# Patient Record
Sex: Female | Born: 1945 | Race: White | Hispanic: No | Smoking: Never smoker
Health system: Southern US, Community
[De-identification: ages and names within clinical notes are randomized; demographics above are authoritative.]

## PROBLEM LIST (undated history)

## (undated) DIAGNOSIS — E049 Nontoxic goiter, unspecified: Secondary | ICD-10-CM

## (undated) DIAGNOSIS — C439 Malignant melanoma of skin, unspecified: Secondary | ICD-10-CM

## (undated) DIAGNOSIS — M797 Fibromyalgia: Secondary | ICD-10-CM

## (undated) DIAGNOSIS — Z780 Asymptomatic menopausal state: Secondary | ICD-10-CM

## (undated) DIAGNOSIS — Z9889 Other specified postprocedural states: Secondary | ICD-10-CM

## (undated) DIAGNOSIS — E079 Disorder of thyroid, unspecified: Secondary | ICD-10-CM

## (undated) DIAGNOSIS — M1990 Unspecified osteoarthritis, unspecified site: Secondary | ICD-10-CM

## (undated) DIAGNOSIS — R197 Diarrhea, unspecified: Secondary | ICD-10-CM

## (undated) DIAGNOSIS — C801 Malignant (primary) neoplasm, unspecified: Secondary | ICD-10-CM

## (undated) DIAGNOSIS — IMO0001 Reserved for inherently not codable concepts without codable children: Secondary | ICD-10-CM

## (undated) DIAGNOSIS — W57XXXA Bitten or stung by nonvenomous insect and other nonvenomous arthropods, initial encounter: Secondary | ICD-10-CM

## (undated) DIAGNOSIS — I2510 Atherosclerotic heart disease of native coronary artery without angina pectoris: Secondary | ICD-10-CM

## (undated) DIAGNOSIS — IMO0002 Reserved for concepts with insufficient information to code with codable children: Secondary | ICD-10-CM

## (undated) DIAGNOSIS — T7840XA Allergy, unspecified, initial encounter: Secondary | ICD-10-CM

## (undated) DIAGNOSIS — E785 Hyperlipidemia, unspecified: Secondary | ICD-10-CM

## (undated) DIAGNOSIS — R011 Cardiac murmur, unspecified: Secondary | ICD-10-CM

## (undated) DIAGNOSIS — N200 Calculus of kidney: Secondary | ICD-10-CM

## (undated) HISTORY — DX: Asymptomatic menopausal state: Z78.0

## (undated) HISTORY — DX: Bitten or stung by nonvenomous insect and other nonvenomous arthropods, initial encounter: W57.XXXA

## (undated) HISTORY — DX: Other specified postprocedural states: Z98.890

## (undated) HISTORY — DX: Calculus of kidney: N20.0

## (undated) HISTORY — DX: Diarrhea, unspecified: R19.7

## (undated) HISTORY — DX: Reserved for inherently not codable concepts without codable children: IMO0001

## (undated) HISTORY — DX: Allergy, unspecified, initial encounter: T78.40XA

## (undated) HISTORY — DX: Hyperlipidemia, unspecified: E78.5

## (undated) HISTORY — PX: KNEE ARTHROSCOPY W/ ACL RECONSTRUCTION: SHX1858

## (undated) HISTORY — DX: Atherosclerotic heart disease of native coronary artery without angina pectoris: I25.10

## (undated) HISTORY — DX: Malignant (primary) neoplasm, unspecified: C80.1

## (undated) HISTORY — DX: Reserved for concepts with insufficient information to code with codable children: IMO0002

## (undated) HISTORY — DX: Nontoxic goiter, unspecified: E04.9

## (undated) HISTORY — DX: Malignant melanoma of skin, unspecified: C43.9

## (undated) HISTORY — DX: Cardiac murmur, unspecified: R01.1

## (undated) HISTORY — DX: Unspecified osteoarthritis, unspecified site: M19.90

## (undated) HISTORY — DX: Fibromyalgia: M79.7

## (undated) HISTORY — DX: Disorder of thyroid, unspecified: E07.9

---

## 1980-08-18 HISTORY — PX: FOOT TENDON SURGERY: SHX958

## 1980-08-18 HISTORY — PX: THYROIDECTOMY: SHX17

## 1985-08-18 HISTORY — PX: ABDOMINAL HYSTERECTOMY: SHX81

## 1997-08-18 HISTORY — PX: OVARIAN CYST REMOVAL: SHX89

## 1998-02-15 ENCOUNTER — Other Ambulatory Visit: Admission: RE | Admit: 1998-02-15 | Discharge: 1998-02-15 | Payer: Self-pay | Admitting: Obstetrics and Gynecology

## 1999-08-09 ENCOUNTER — Encounter: Admission: RE | Admit: 1999-08-09 | Discharge: 1999-08-09 | Payer: Self-pay | Admitting: Family Medicine

## 1999-08-09 ENCOUNTER — Encounter: Payer: Self-pay | Admitting: Family Medicine

## 2000-07-21 ENCOUNTER — Other Ambulatory Visit: Admission: RE | Admit: 2000-07-21 | Discharge: 2000-07-21 | Payer: Self-pay | Admitting: Family Medicine

## 2001-10-09 ENCOUNTER — Observation Stay (HOSPITAL_COMMUNITY): Admission: EM | Admit: 2001-10-09 | Discharge: 2001-10-10 | Payer: Self-pay | Admitting: Emergency Medicine

## 2001-10-09 ENCOUNTER — Encounter: Payer: Self-pay | Admitting: Emergency Medicine

## 2002-10-14 ENCOUNTER — Ambulatory Visit (HOSPITAL_COMMUNITY): Admission: RE | Admit: 2002-10-14 | Discharge: 2002-10-14 | Payer: Self-pay | Admitting: Family Medicine

## 2003-11-23 ENCOUNTER — Other Ambulatory Visit: Admission: RE | Admit: 2003-11-23 | Discharge: 2003-11-23 | Payer: Self-pay | Admitting: Family Medicine

## 2004-06-27 ENCOUNTER — Encounter: Admission: RE | Admit: 2004-06-27 | Discharge: 2004-06-27 | Payer: Self-pay | Admitting: Endocrinology

## 2004-09-19 ENCOUNTER — Ambulatory Visit (HOSPITAL_COMMUNITY): Admission: RE | Admit: 2004-09-19 | Discharge: 2004-09-19 | Payer: Self-pay | Admitting: Gastroenterology

## 2004-12-02 ENCOUNTER — Encounter: Payer: Self-pay | Admitting: Gastroenterology

## 2004-12-03 ENCOUNTER — Encounter: Payer: Self-pay | Admitting: Gastroenterology

## 2004-12-04 ENCOUNTER — Other Ambulatory Visit: Admission: RE | Admit: 2004-12-04 | Discharge: 2004-12-04 | Payer: Self-pay | Admitting: Family Medicine

## 2005-08-09 ENCOUNTER — Encounter: Payer: Self-pay | Admitting: Family Medicine

## 2005-10-16 ENCOUNTER — Encounter: Admission: RE | Admit: 2005-10-16 | Discharge: 2005-10-16 | Payer: Self-pay | Admitting: Gastroenterology

## 2005-12-09 ENCOUNTER — Encounter: Payer: Self-pay | Admitting: Family Medicine

## 2005-12-09 ENCOUNTER — Other Ambulatory Visit: Admission: RE | Admit: 2005-12-09 | Discharge: 2005-12-09 | Payer: Self-pay | Admitting: Family Medicine

## 2005-12-09 ENCOUNTER — Ambulatory Visit: Payer: Self-pay | Admitting: Family Medicine

## 2005-12-27 ENCOUNTER — Ambulatory Visit: Payer: Self-pay | Admitting: Internal Medicine

## 2005-12-29 ENCOUNTER — Ambulatory Visit: Payer: Self-pay | Admitting: Family Medicine

## 2006-01-05 ENCOUNTER — Ambulatory Visit: Payer: Self-pay | Admitting: Family Medicine

## 2006-01-13 ENCOUNTER — Ambulatory Visit: Payer: Self-pay | Admitting: Family Medicine

## 2006-01-13 ENCOUNTER — Ambulatory Visit: Payer: Self-pay | Admitting: *Deleted

## 2006-01-13 ENCOUNTER — Encounter: Admission: RE | Admit: 2006-01-13 | Discharge: 2006-01-13 | Payer: Self-pay | Admitting: Family Medicine

## 2006-01-16 DIAGNOSIS — C801 Malignant (primary) neoplasm, unspecified: Secondary | ICD-10-CM

## 2006-01-16 HISTORY — DX: Malignant (primary) neoplasm, unspecified: C80.1

## 2006-02-26 ENCOUNTER — Ambulatory Visit: Payer: Self-pay | Admitting: Family Medicine

## 2006-03-03 ENCOUNTER — Ambulatory Visit: Payer: Self-pay

## 2006-03-03 ENCOUNTER — Encounter: Payer: Self-pay | Admitting: Cardiovascular Disease

## 2006-03-10 ENCOUNTER — Ambulatory Visit: Payer: Self-pay | Admitting: Family Medicine

## 2006-03-19 ENCOUNTER — Ambulatory Visit: Payer: Self-pay | Admitting: Family Medicine

## 2006-04-16 ENCOUNTER — Encounter: Admission: RE | Admit: 2006-04-16 | Discharge: 2006-04-16 | Payer: Self-pay | Admitting: Family Medicine

## 2006-05-06 ENCOUNTER — Encounter: Admission: RE | Admit: 2006-05-06 | Discharge: 2006-05-06 | Payer: Self-pay | Admitting: Endocrinology

## 2006-05-11 ENCOUNTER — Ambulatory Visit: Payer: Self-pay | Admitting: Gastroenterology

## 2006-06-17 ENCOUNTER — Ambulatory Visit: Payer: Self-pay | Admitting: *Deleted

## 2006-07-29 ENCOUNTER — Ambulatory Visit: Payer: Self-pay | Admitting: *Deleted

## 2006-08-04 ENCOUNTER — Ambulatory Visit: Payer: Self-pay | Admitting: Family Medicine

## 2006-08-04 LAB — CONVERTED CEMR LAB: Free T4: 0.6 ng/dL (ref 0.6–1.6)

## 2006-10-22 ENCOUNTER — Ambulatory Visit: Payer: Self-pay | Admitting: Internal Medicine

## 2006-10-30 ENCOUNTER — Ambulatory Visit: Payer: Self-pay | Admitting: Family Medicine

## 2006-10-30 LAB — CONVERTED CEMR LAB: TSH: 4.21 microintl units/mL (ref 0.35–5.50)

## 2006-11-12 ENCOUNTER — Encounter: Admission: RE | Admit: 2006-11-12 | Discharge: 2006-11-12 | Payer: Self-pay | Admitting: Endocrinology

## 2006-12-15 ENCOUNTER — Encounter: Payer: Self-pay | Admitting: Family Medicine

## 2006-12-15 ENCOUNTER — Other Ambulatory Visit: Admission: RE | Admit: 2006-12-15 | Discharge: 2006-12-15 | Payer: Self-pay | Admitting: Family Medicine

## 2006-12-15 ENCOUNTER — Ambulatory Visit: Payer: Self-pay | Admitting: Family Medicine

## 2006-12-15 DIAGNOSIS — E039 Hypothyroidism, unspecified: Secondary | ICD-10-CM

## 2006-12-15 DIAGNOSIS — Z87442 Personal history of urinary calculi: Secondary | ICD-10-CM | POA: Insufficient documentation

## 2006-12-15 DIAGNOSIS — E042 Nontoxic multinodular goiter: Secondary | ICD-10-CM | POA: Insufficient documentation

## 2006-12-15 DIAGNOSIS — M797 Fibromyalgia: Secondary | ICD-10-CM

## 2006-12-15 DIAGNOSIS — S83509A Sprain of unspecified cruciate ligament of unspecified knee, initial encounter: Secondary | ICD-10-CM

## 2006-12-15 DIAGNOSIS — C4359 Malignant melanoma of other part of trunk: Secondary | ICD-10-CM

## 2006-12-15 LAB — CONVERTED CEMR LAB
ALT: 25 units/L (ref 0–40)
Alkaline Phosphatase: 83 units/L (ref 39–117)
BUN: 9 mg/dL (ref 6–23)
Bilirubin, Direct: 0.1 mg/dL (ref 0.0–0.3)
CO2: 30 meq/L (ref 19–32)
Chloride: 106 meq/L (ref 96–112)
Creatinine, Ser: 0.7 mg/dL (ref 0.4–1.2)
Eosinophils Absolute: 0.1 10*3/uL (ref 0.0–0.6)
HCT: 39.7 % (ref 36.0–46.0)
Hemoglobin: 13.8 g/dL (ref 12.0–15.0)
Hgb A1c MFr Bld: 6.1 % — ABNORMAL HIGH (ref 4.6–6.0)
Lymphocytes Relative: 27.5 % (ref 12.0–46.0)
MCV: 89.3 fL (ref 78.0–100.0)
Neutro Abs: 4.5 10*3/uL (ref 1.4–7.7)
Platelets: 325 10*3/uL (ref 150–400)
Protein, U semiquant: NEGATIVE
RBC: 4.45 M/uL (ref 3.87–5.11)
RDW: 12.9 % (ref 11.5–14.6)
Sodium: 142 meq/L (ref 135–145)
Specific Gravity, Urine: 1.02
Total Protein: 6.7 g/dL (ref 6.0–8.3)
Urobilinogen, UA: 0.2
Vitamin B-12: 721 pg/mL (ref 211–911)
WBC Urine, dipstick: NEGATIVE
pH: 6.5

## 2007-02-03 IMAGING — RF DG ESOPHAGUS
7 series · 19 of 24 positions shown · non-contrast
Comparison: None.

CLINICAL DATA: Dysphagia.  
 BARIUM ESOPHAGRAM:

[Series 1: run · 4 of 8 slices shown (1 of 7)]
[im 1/8]
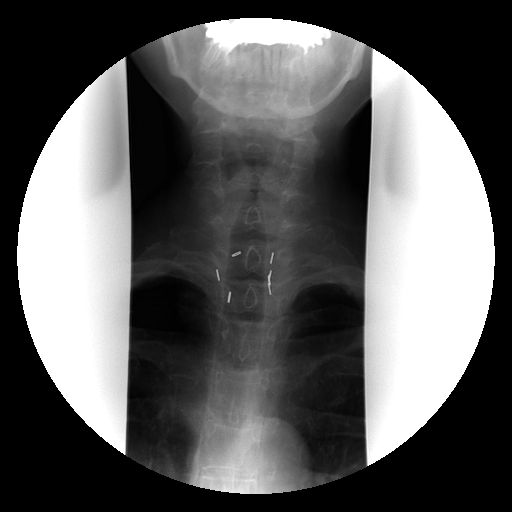
[im 2/8]
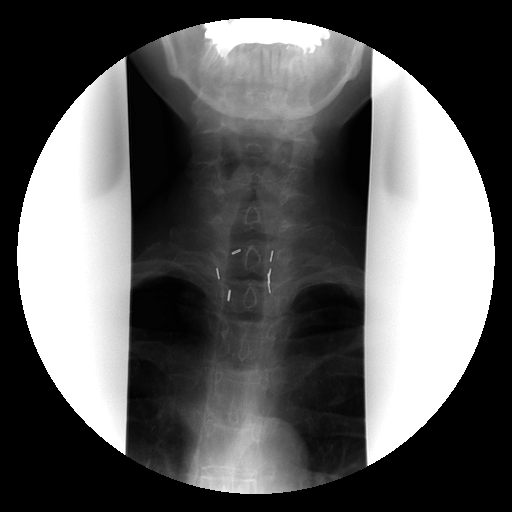
[im 6/8]
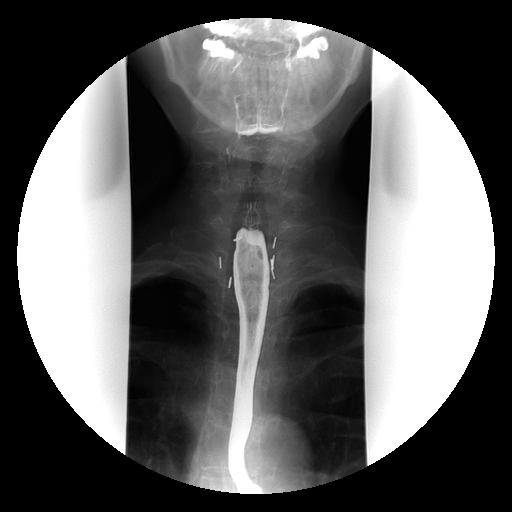
[im 8/8]
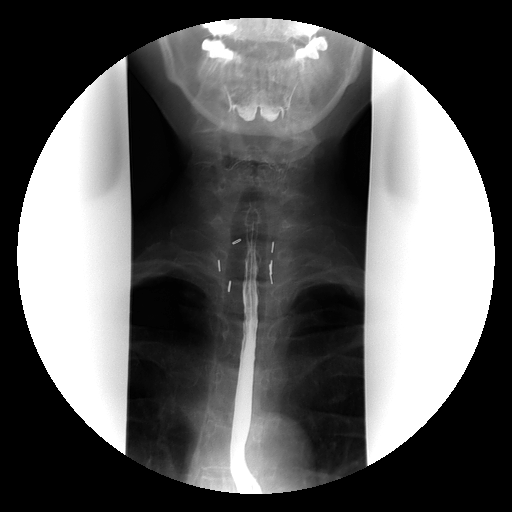

[Series 2: run · 3 of 6 slices shown (2 of 7)]
[im 1/6]
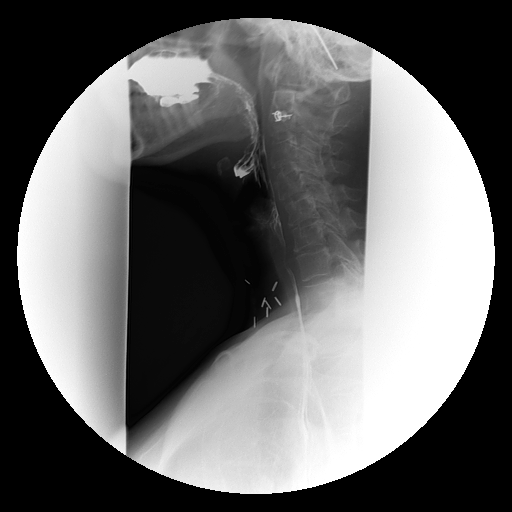
[im 2/6]
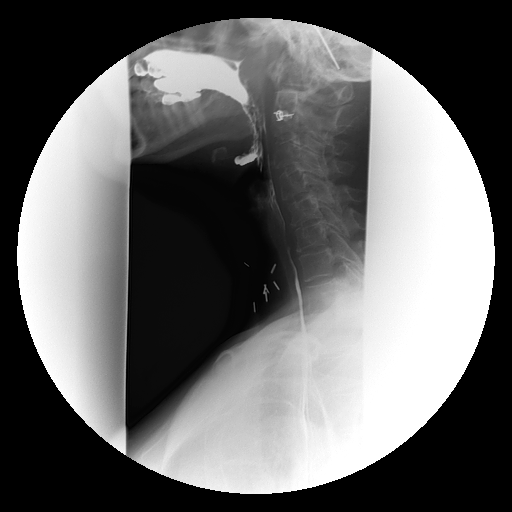
[im 6/6]
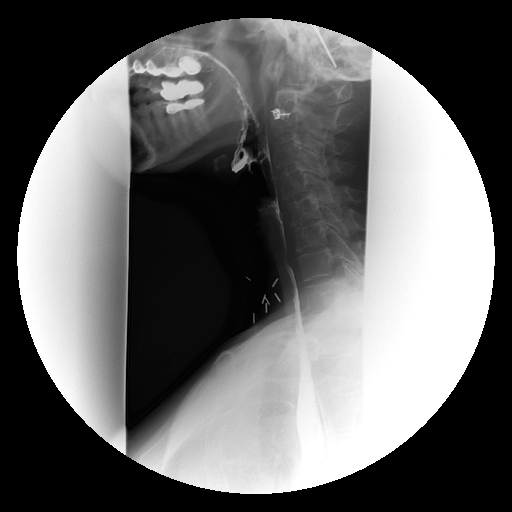

[Series 3: run · 2 of 5 slices shown (3 of 7)]
[im 1/5]
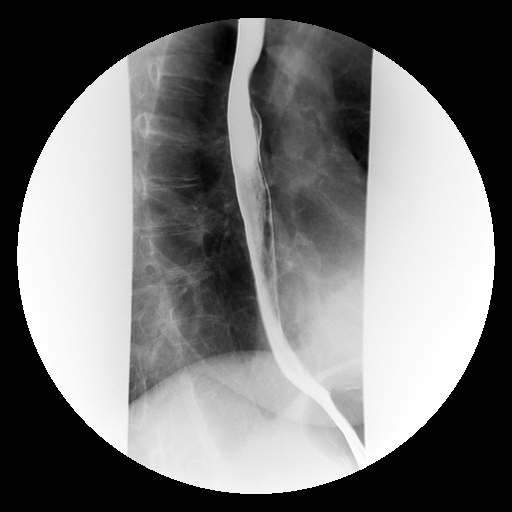
[im 3/5]
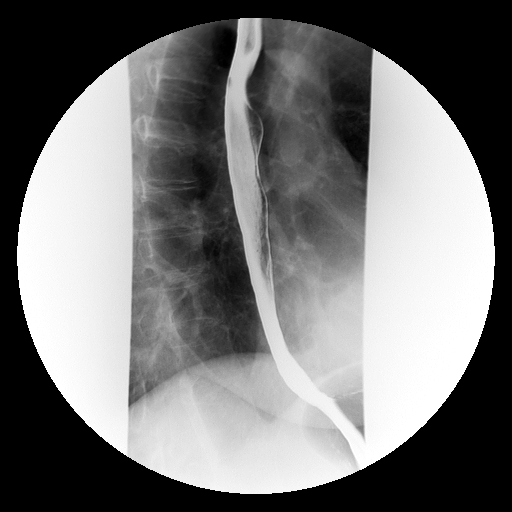

[Series 4: run · 6 of 10 slices shown (4 of 7)]
[im 1/10]
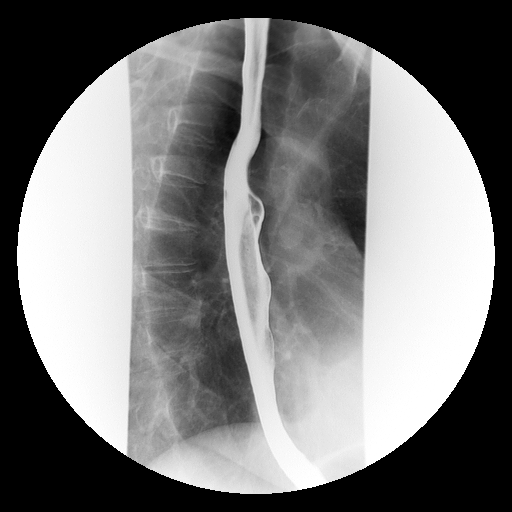
[im 2/10]
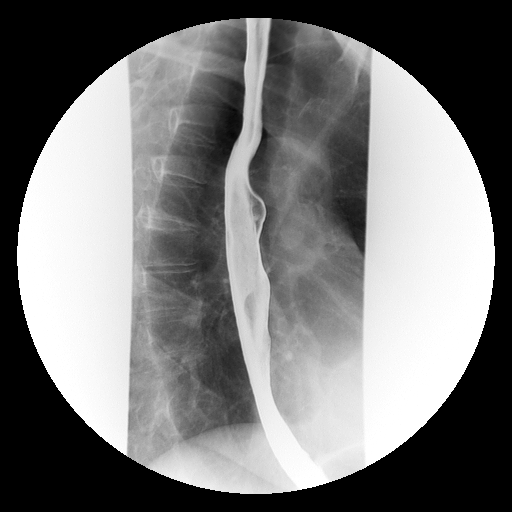
[im 4/10]
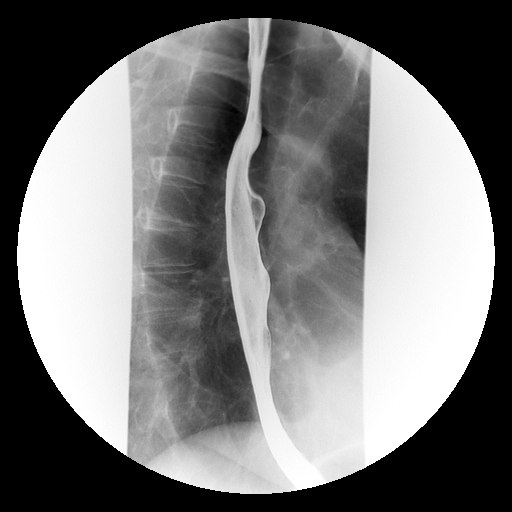
[im 5/10]
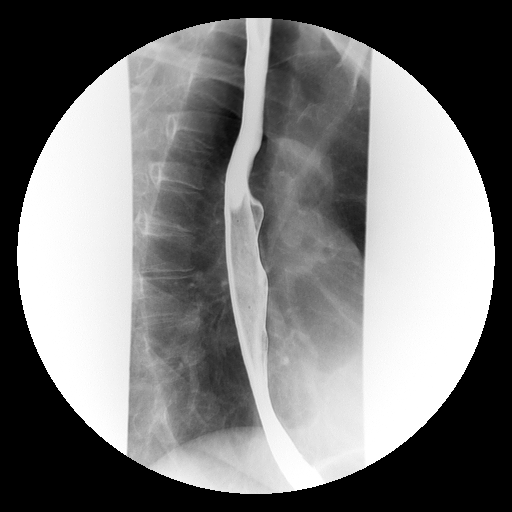
[im 8/10]
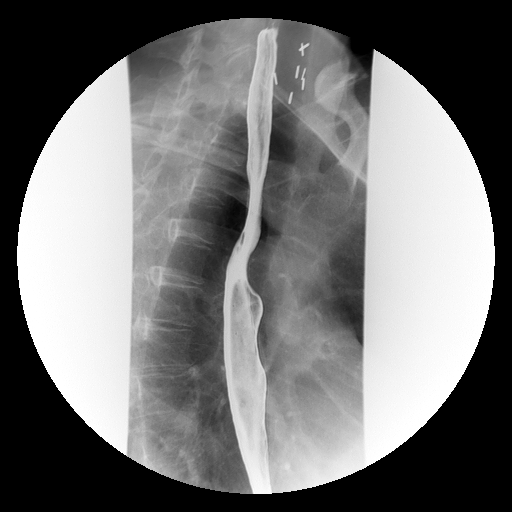
[im 10/10]
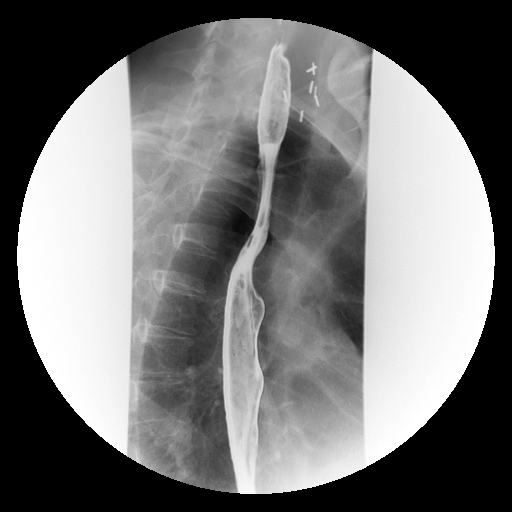

[Series 5: run · 2 of 4 slices shown (5 of 7)]
[im 1/4]
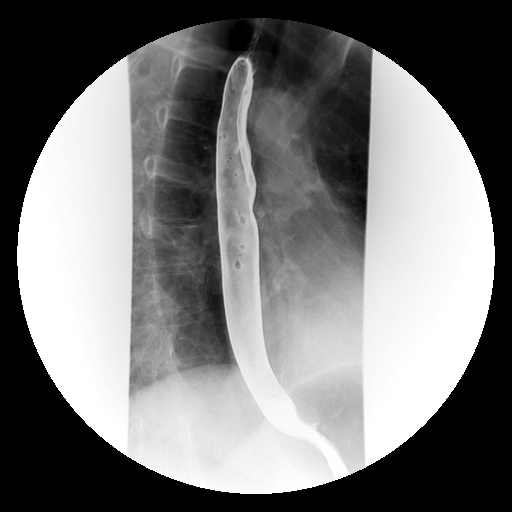
[im 2/4]
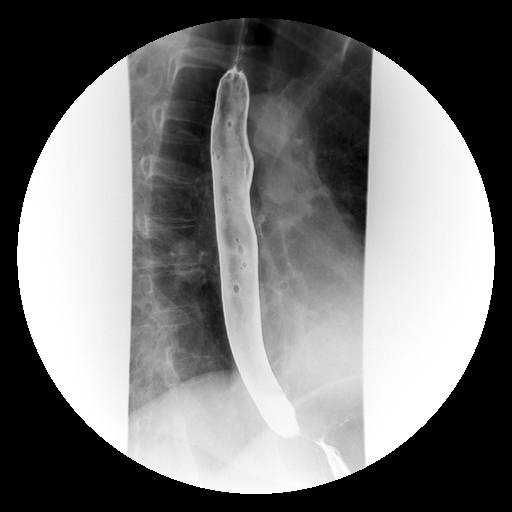

[Series 6: run · 1 of 1 slices shown (6 of 7)]
[im 1/1]
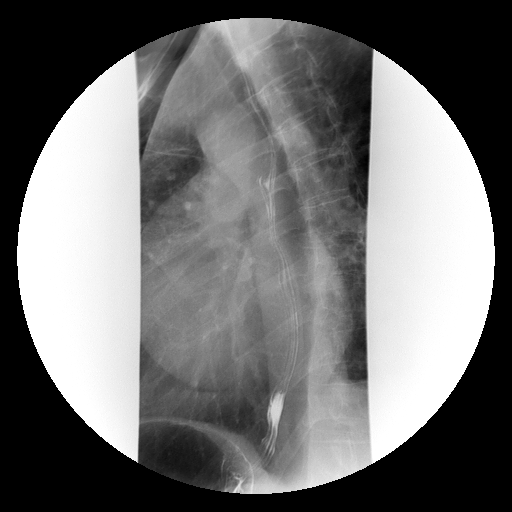

[Series 7: run · 1 of 1 slices shown (7 of 7)]
[im 1/1]
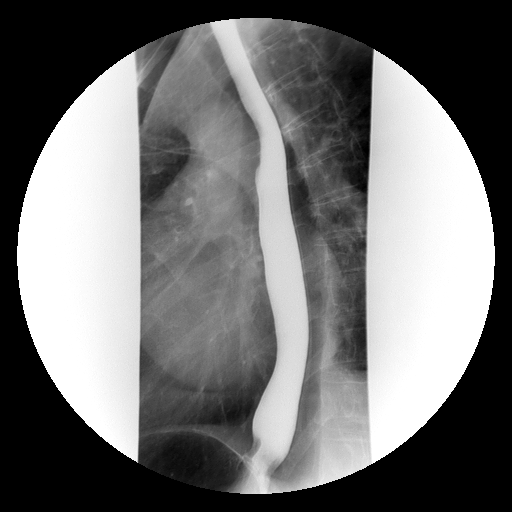

[19 of 24 positions shown; findings below may reference images not displayed]

AP and lateral imaging of the hypopharynx was assessed during swallowing.  These views are normal.  Patient is noted to have surgical clips in the anterior neck consistent with prior thyroidectomy.  
 Double contrast evaluation of the esophagus is normal.  Specifically, there is no evidence for esophageal stricture, mass effect, diverticulum, or mucosal ulceration.  No evidence for hiatal hernia.  Mucosal relief views demonstrate no esophageal fold thickening to suggest esophagitis. 
 The patient was placed in an RAO prone position and monitored fluoroscopically while swallowing.  Primary peristalsis is preserved on all swallows with normal esophageal motility.
IMPRESSION: Normal barium esophagram.

## 2007-03-30 ENCOUNTER — Encounter: Payer: Self-pay | Admitting: Family Medicine

## 2007-04-12 ENCOUNTER — Ambulatory Visit: Payer: Self-pay | Admitting: Family Medicine

## 2007-04-12 DIAGNOSIS — R197 Diarrhea, unspecified: Secondary | ICD-10-CM | POA: Insufficient documentation

## 2007-04-12 DIAGNOSIS — B373 Candidiasis of vulva and vagina: Secondary | ICD-10-CM

## 2007-04-13 ENCOUNTER — Encounter: Payer: Self-pay | Admitting: Family Medicine

## 2007-04-16 ENCOUNTER — Ambulatory Visit: Payer: Self-pay | Admitting: Internal Medicine

## 2007-04-16 ENCOUNTER — Encounter (INDEPENDENT_AMBULATORY_CARE_PROVIDER_SITE_OTHER): Payer: Self-pay | Admitting: *Deleted

## 2007-08-04 IMAGING — US US ABDOMEN COMPLETE
1 series · 14 of 25 positions shown · non-contrast
Comparison: none

CLINICAL DATA: Abdominal pain. Elevated LFTs.
 ABDOMEN ULTRASOUND:
TECHNIQUE: Complete abdominal ultrasound examination was performed including evaluation of the liver, gallbladder, bile ducts, pancreas, kidneys, spleen, IVC, and abdominal aorta.
 The gallbladder is well seen and no gallstones are noted.  No gallbladder wall thickening is seen.  The liver has a normal echogenic pattern.  The common bile duct is normal measuring 2.1 to 3.0 mm in diameter.  The IVC, pancreas, and spleen are normal.  No hydronephrosis is noted.  The right kidney measures 11.1 cm sagittally with the left kidney measuring 11.6 cm.  The abdominal aorta is normal in caliber.

[Series 1: us abdomen complete · 0.31mm/px · 14 of 79 slices shown]
[im 1/79]
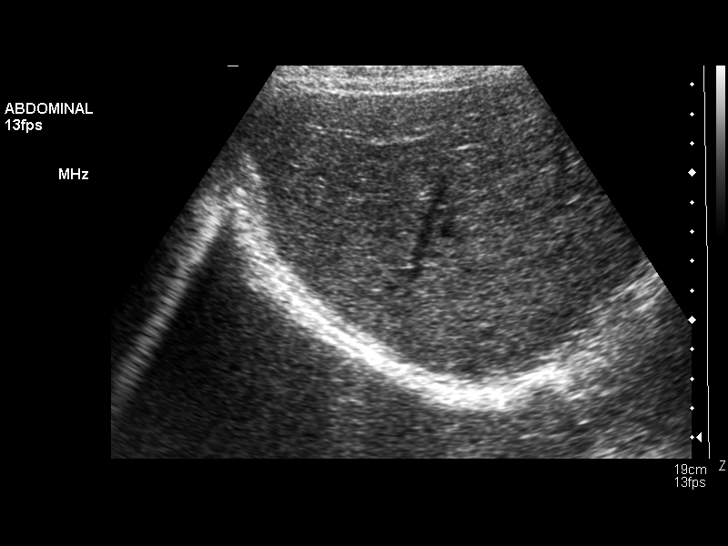
[im 7/79]
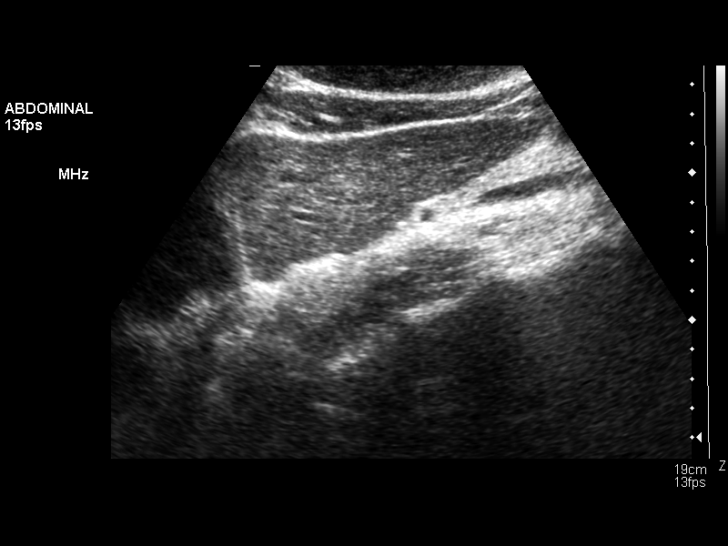
[im 14/79]
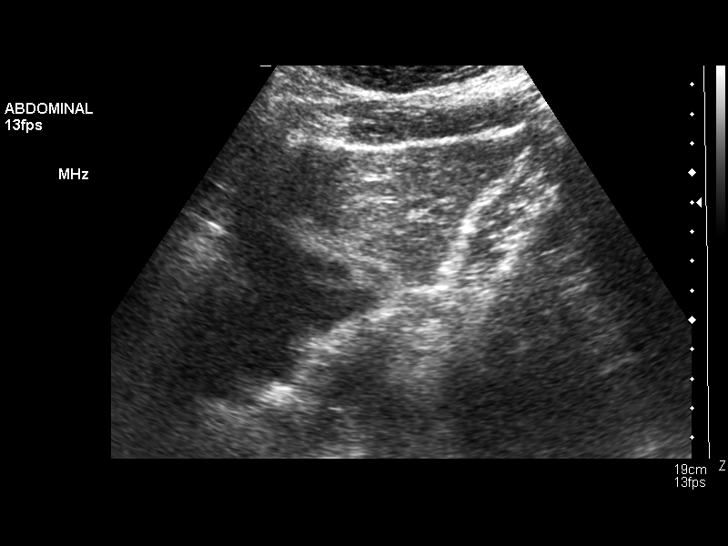
[im 20/79]
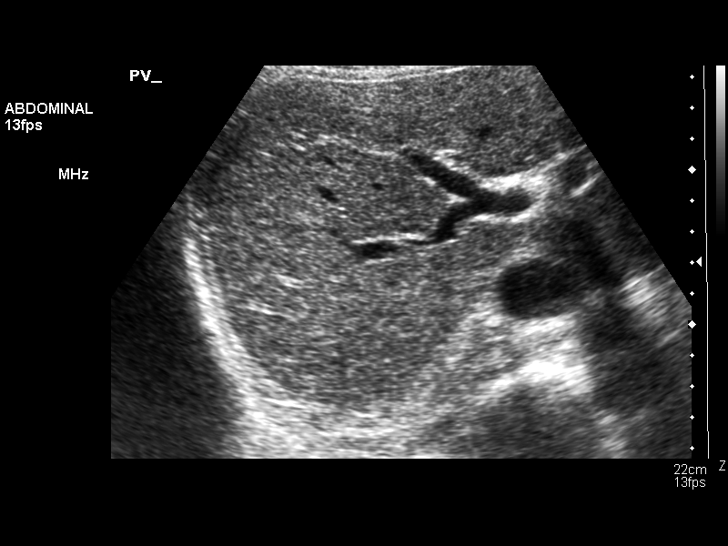
[im 27/79]
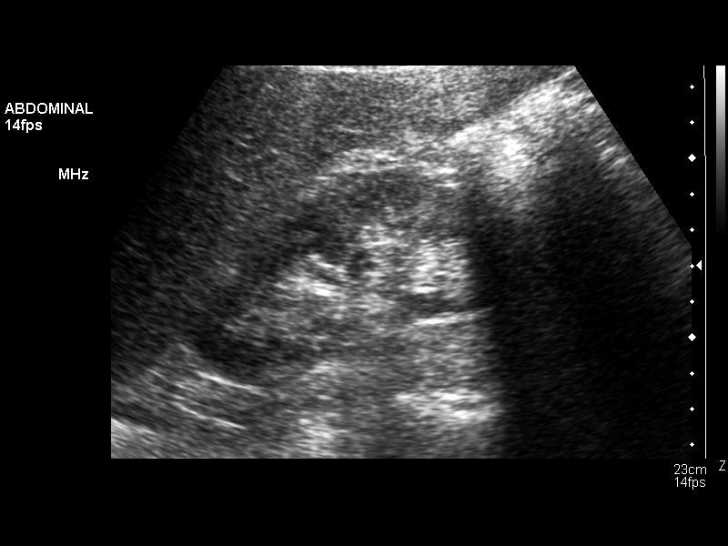
[im 30/79]
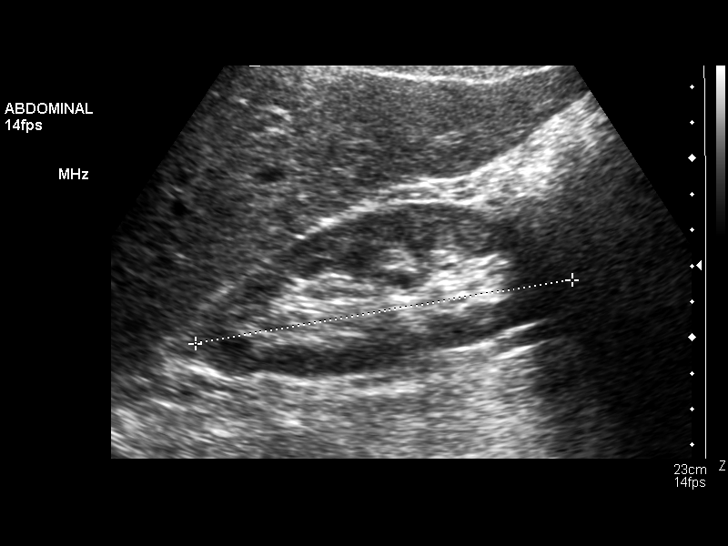
[im 36/79]
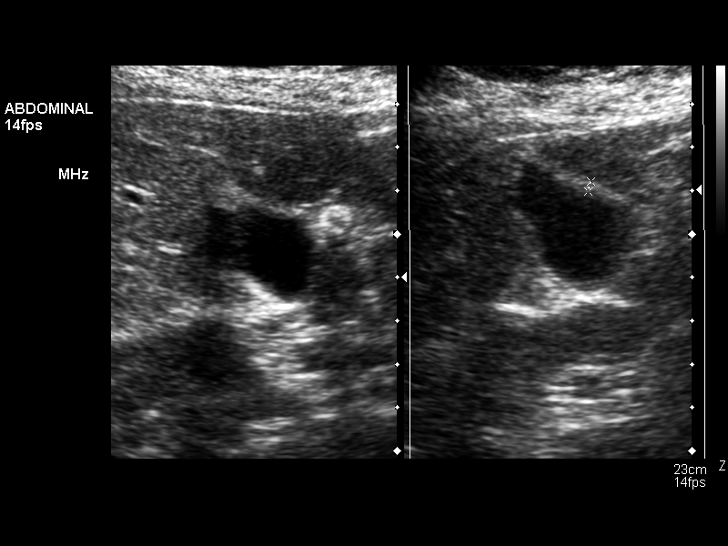
[im 43/79]
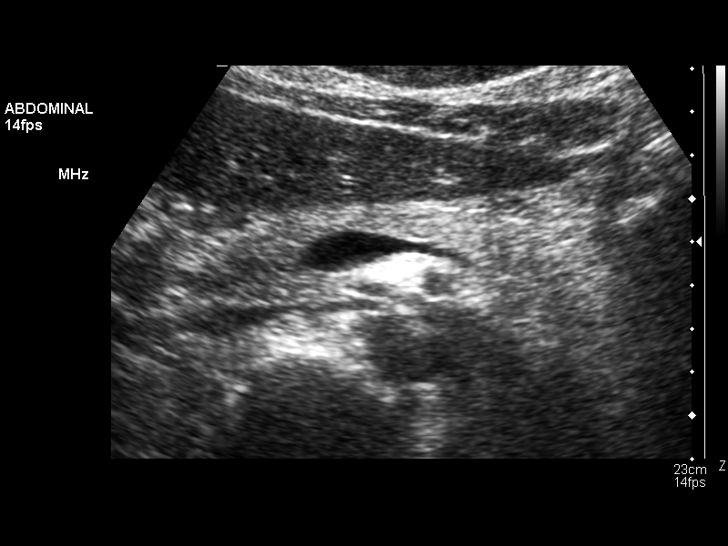
[im 49/79]
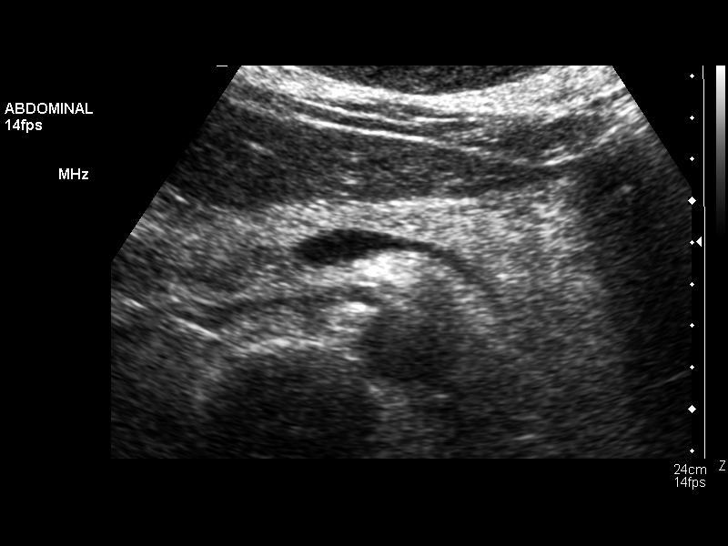
[im 53/79]
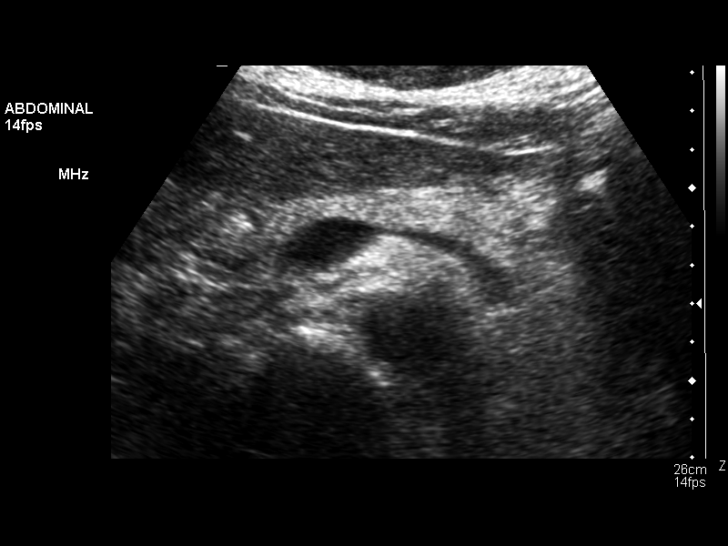
[im 59/79]
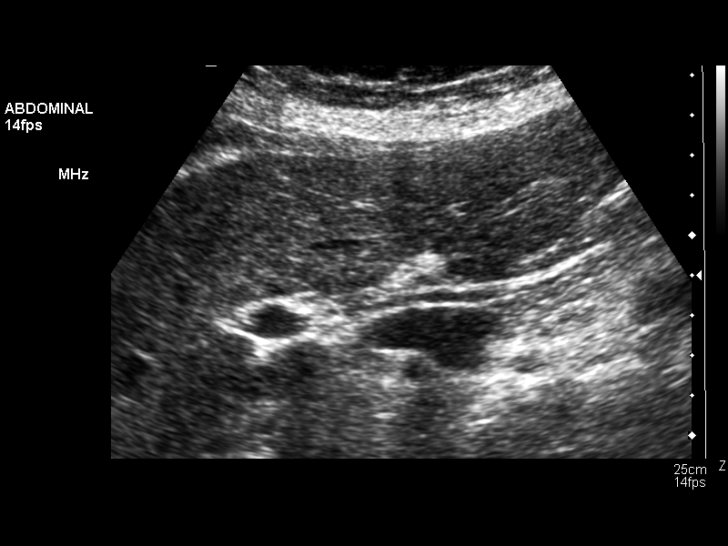
[im 66/79]
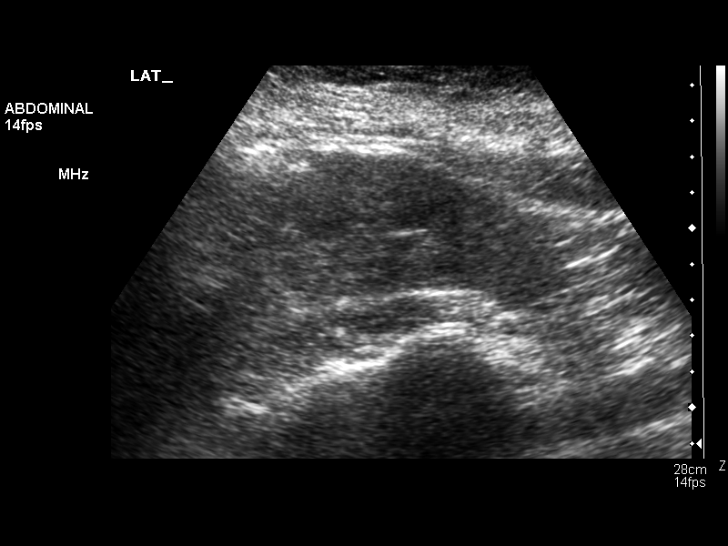
[im 72/79]
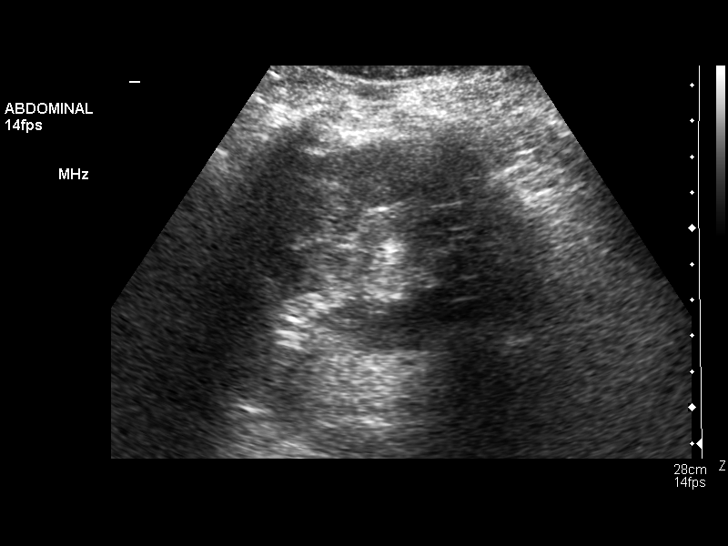
[im 79/79]
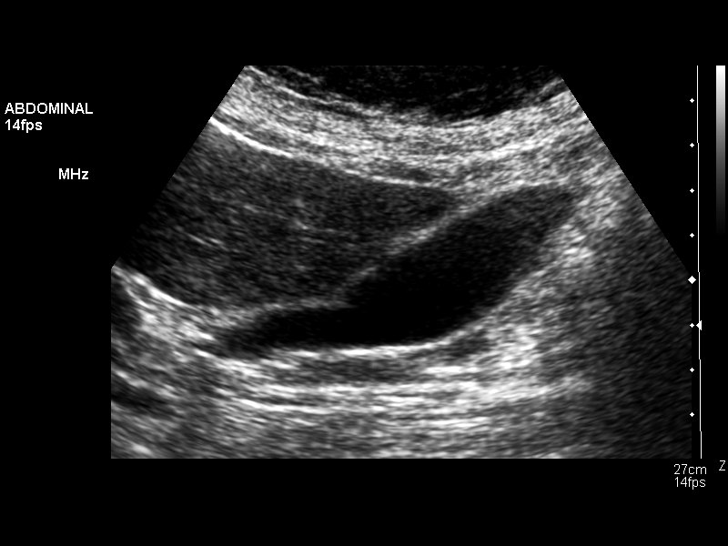

[14 of 25 positions shown; findings below may reference images not displayed]

IMPRESSION: 1.  No gallstones.
 2.  No gallbladder wall thickening or ductal dilatation is seen.

## 2007-10-11 ENCOUNTER — Encounter: Payer: Self-pay | Admitting: Family Medicine

## 2007-10-14 ENCOUNTER — Ambulatory Visit: Payer: Self-pay | Admitting: Family Medicine

## 2007-10-14 DIAGNOSIS — IMO0002 Reserved for concepts with insufficient information to code with codable children: Secondary | ICD-10-CM

## 2007-10-25 ENCOUNTER — Encounter: Payer: Self-pay | Admitting: Family Medicine

## 2007-11-19 ENCOUNTER — Encounter: Payer: Self-pay | Admitting: Family Medicine

## 2007-11-19 LAB — HM MAMMOGRAPHY: HM Mammogram: NORMAL

## 2007-12-15 ENCOUNTER — Telehealth (INDEPENDENT_AMBULATORY_CARE_PROVIDER_SITE_OTHER): Payer: Self-pay | Admitting: *Deleted

## 2007-12-16 ENCOUNTER — Ambulatory Visit: Payer: Self-pay | Admitting: Internal Medicine

## 2007-12-16 DIAGNOSIS — R609 Edema, unspecified: Secondary | ICD-10-CM

## 2007-12-16 DIAGNOSIS — B002 Herpesviral gingivostomatitis and pharyngotonsillitis: Secondary | ICD-10-CM

## 2007-12-16 LAB — CONVERTED CEMR LAB: Vit D, 1,25-Dihydroxy: 37 (ref 30–89)

## 2007-12-24 ENCOUNTER — Encounter: Payer: Self-pay | Admitting: Internal Medicine

## 2007-12-28 ENCOUNTER — Encounter (INDEPENDENT_AMBULATORY_CARE_PROVIDER_SITE_OTHER): Payer: Self-pay | Admitting: *Deleted

## 2008-02-09 ENCOUNTER — Other Ambulatory Visit: Admission: RE | Admit: 2008-02-09 | Discharge: 2008-02-09 | Payer: Self-pay | Admitting: Family Medicine

## 2008-02-09 ENCOUNTER — Ambulatory Visit: Payer: Self-pay | Admitting: Family Medicine

## 2008-02-09 ENCOUNTER — Encounter: Payer: Self-pay | Admitting: Family Medicine

## 2008-02-09 DIAGNOSIS — N952 Postmenopausal atrophic vaginitis: Secondary | ICD-10-CM

## 2008-02-09 DIAGNOSIS — C439 Malignant melanoma of skin, unspecified: Secondary | ICD-10-CM

## 2008-02-09 DIAGNOSIS — G47419 Narcolepsy without cataplexy: Secondary | ICD-10-CM | POA: Insufficient documentation

## 2008-02-09 DIAGNOSIS — G4700 Insomnia, unspecified: Secondary | ICD-10-CM

## 2008-02-09 DIAGNOSIS — L740 Miliaria rubra: Secondary | ICD-10-CM | POA: Insufficient documentation

## 2008-02-10 ENCOUNTER — Encounter (INDEPENDENT_AMBULATORY_CARE_PROVIDER_SITE_OTHER): Payer: Self-pay | Admitting: *Deleted

## 2008-02-15 ENCOUNTER — Telehealth (INDEPENDENT_AMBULATORY_CARE_PROVIDER_SITE_OTHER): Payer: Self-pay | Admitting: *Deleted

## 2008-02-15 DIAGNOSIS — N951 Menopausal and female climacteric states: Secondary | ICD-10-CM | POA: Insufficient documentation

## 2008-02-15 LAB — CONVERTED CEMR LAB
ALT: 27 units/L (ref 0–35)
AST: 25 units/L (ref 0–37)
Alkaline Phosphatase: 75 units/L (ref 39–117)
Basophils Relative: 0.4 % (ref 0.0–1.0)
Calcium: 9.4 mg/dL (ref 8.4–10.5)
Chloride: 106 meq/L (ref 96–112)
Cholesterol: 260 mg/dL (ref 0–200)
Creatinine, Ser: 0.7 mg/dL (ref 0.4–1.2)
Direct LDL: 173.6 mg/dL
Eosinophils Absolute: 0.1 10*3/uL (ref 0.0–0.7)
Eosinophils Relative: 1.2 % (ref 0.0–5.0)
GFR calc non Af Amer: 90 mL/min
Glucose, Bld: 100 mg/dL — ABNORMAL HIGH (ref 70–99)
Hemoglobin: 14 g/dL (ref 12.0–15.0)
Lymphocytes Relative: 31.6 % (ref 12.0–46.0)
MCHC: 35 g/dL (ref 30.0–36.0)
Monocytes Relative: 4.9 % (ref 3.0–12.0)
Neutro Abs: 4.1 10*3/uL (ref 1.4–7.7)
Potassium: 3.9 meq/L (ref 3.5–5.1)
RDW: 12.3 % (ref 11.5–14.6)
Total CHOL/HDL Ratio: 4.6
Triglycerides: 112 mg/dL (ref 0–149)
VLDL: 22 mg/dL (ref 0–40)
Vitamin B-12: 681 pg/mL (ref 211–911)

## 2008-02-16 ENCOUNTER — Encounter (INDEPENDENT_AMBULATORY_CARE_PROVIDER_SITE_OTHER): Payer: Self-pay | Admitting: *Deleted

## 2008-03-16 ENCOUNTER — Ambulatory Visit: Payer: Self-pay | Admitting: Internal Medicine

## 2008-03-16 ENCOUNTER — Ambulatory Visit: Payer: Self-pay | Admitting: Family Medicine

## 2008-03-17 ENCOUNTER — Ambulatory Visit: Payer: Self-pay | Admitting: Internal Medicine

## 2008-03-27 ENCOUNTER — Telehealth (INDEPENDENT_AMBULATORY_CARE_PROVIDER_SITE_OTHER): Payer: Self-pay | Admitting: *Deleted

## 2008-03-31 ENCOUNTER — Telehealth (INDEPENDENT_AMBULATORY_CARE_PROVIDER_SITE_OTHER): Payer: Self-pay | Admitting: *Deleted

## 2008-04-03 ENCOUNTER — Ambulatory Visit: Payer: Self-pay | Admitting: Family Medicine

## 2008-04-03 DIAGNOSIS — M79609 Pain in unspecified limb: Secondary | ICD-10-CM

## 2008-04-04 ENCOUNTER — Encounter: Payer: Self-pay | Admitting: Family Medicine

## 2008-04-04 ENCOUNTER — Ambulatory Visit: Payer: Self-pay

## 2008-04-04 ENCOUNTER — Encounter: Payer: Self-pay | Admitting: Cardiovascular Disease

## 2008-04-06 ENCOUNTER — Telehealth (INDEPENDENT_AMBULATORY_CARE_PROVIDER_SITE_OTHER): Payer: Self-pay | Admitting: *Deleted

## 2008-04-26 ENCOUNTER — Encounter: Payer: Self-pay | Admitting: Family Medicine

## 2008-05-22 ENCOUNTER — Encounter: Payer: Self-pay | Admitting: Family Medicine

## 2008-05-22 ENCOUNTER — Ambulatory Visit: Payer: Self-pay | Admitting: Surgery

## 2008-05-31 ENCOUNTER — Telehealth: Payer: Self-pay | Admitting: Gastroenterology

## 2008-05-31 ENCOUNTER — Encounter (INDEPENDENT_AMBULATORY_CARE_PROVIDER_SITE_OTHER): Payer: Self-pay | Admitting: *Deleted

## 2008-07-18 ENCOUNTER — Ambulatory Visit: Payer: Self-pay | Admitting: Family Medicine

## 2008-07-18 DIAGNOSIS — B9789 Other viral agents as the cause of diseases classified elsewhere: Secondary | ICD-10-CM

## 2008-07-18 LAB — CONVERTED CEMR LAB: Inflenza A Ag: NEGATIVE

## 2008-08-08 ENCOUNTER — Telehealth: Payer: Self-pay | Admitting: Family Medicine

## 2008-08-09 ENCOUNTER — Ambulatory Visit: Payer: Self-pay | Admitting: Family Medicine

## 2008-08-25 ENCOUNTER — Ambulatory Visit: Payer: Self-pay | Admitting: Cardiology

## 2008-08-25 LAB — CONVERTED CEMR LAB
ALT: 32 units/L (ref 0–35)
Alkaline Phosphatase: 64 units/L (ref 39–117)
BUN: 11 mg/dL (ref 6–23)
Bilirubin, Direct: 0.1 mg/dL (ref 0.0–0.3)
CO2: 29 meq/L (ref 19–32)
Creatinine, Ser: 0.7 mg/dL (ref 0.4–1.2)
Eosinophils Absolute: 0.1 10*3/uL (ref 0.0–0.7)
Eosinophils Relative: 1.3 % (ref 0.0–5.0)
GFR calc Af Amer: 109 mL/min
GFR calc non Af Amer: 90 mL/min
HCT: 42.3 % (ref 36.0–46.0)
MCV: 90.3 fL (ref 78.0–100.0)
Magnesium: 2.3 mg/dL (ref 1.5–2.5)
Neutro Abs: 4.8 10*3/uL (ref 1.4–7.7)
Potassium: 4.4 meq/L (ref 3.5–5.1)
TSH: 0.12 microintl units/mL — ABNORMAL LOW (ref 0.35–5.50)
Total Bilirubin: 0.8 mg/dL (ref 0.3–1.2)
Total CHOL/HDL Ratio: 4.4
Total Protein: 6.9 g/dL (ref 6.0–8.3)

## 2008-09-05 ENCOUNTER — Encounter: Payer: Self-pay | Admitting: Family Medicine

## 2008-09-05 ENCOUNTER — Ambulatory Visit: Payer: Self-pay

## 2008-09-05 ENCOUNTER — Encounter: Payer: Self-pay | Admitting: Cardiology

## 2008-09-07 ENCOUNTER — Ambulatory Visit: Payer: Self-pay | Admitting: Cardiology

## 2008-09-20 ENCOUNTER — Encounter (INDEPENDENT_AMBULATORY_CARE_PROVIDER_SITE_OTHER): Payer: Self-pay | Admitting: *Deleted

## 2008-10-20 ENCOUNTER — Ambulatory Visit: Payer: Self-pay | Admitting: Family Medicine

## 2008-10-20 DIAGNOSIS — R109 Unspecified abdominal pain: Secondary | ICD-10-CM

## 2008-10-23 ENCOUNTER — Encounter: Admission: RE | Admit: 2008-10-23 | Discharge: 2008-10-23 | Payer: Self-pay | Admitting: Family Medicine

## 2008-10-23 ENCOUNTER — Telehealth (INDEPENDENT_AMBULATORY_CARE_PROVIDER_SITE_OTHER): Payer: Self-pay | Admitting: *Deleted

## 2008-10-23 LAB — CONVERTED CEMR LAB
AST: 31 units/L (ref 0–37)
Alkaline Phosphatase: 75 units/L (ref 39–117)
Amylase: 52 units/L (ref 27–131)
Basophils Relative: 0.7 % (ref 0.0–3.0)
Bilirubin, Direct: 0.1 mg/dL (ref 0.0–0.3)
Creatinine, Ser: 0.6 mg/dL (ref 0.4–1.2)
GFR calc Af Amer: 130 mL/min
GFR calc non Af Amer: 107 mL/min
Hemoglobin: 13.6 g/dL (ref 12.0–15.0)
Lipase: 32 units/L (ref 11.0–59.0)
MCV: 87.1 fL (ref 78.0–100.0)
Potassium: 4.1 meq/L (ref 3.5–5.1)
RBC: 4.61 M/uL (ref 3.87–5.11)
T3, Free: 3.3 pg/mL (ref 2.3–4.2)
Total Protein: 6.7 g/dL (ref 6.0–8.3)
WBC: 7.1 10*3/uL (ref 4.5–10.5)

## 2008-11-29 ENCOUNTER — Encounter (INDEPENDENT_AMBULATORY_CARE_PROVIDER_SITE_OTHER): Payer: Self-pay | Admitting: *Deleted

## 2009-01-19 ENCOUNTER — Telehealth: Payer: Self-pay | Admitting: Family Medicine

## 2009-01-19 ENCOUNTER — Emergency Department (HOSPITAL_COMMUNITY): Admission: EM | Admit: 2009-01-19 | Discharge: 2009-01-19 | Payer: Self-pay | Admitting: Emergency Medicine

## 2009-01-23 ENCOUNTER — Ambulatory Visit: Payer: Self-pay | Admitting: Family Medicine

## 2009-01-23 DIAGNOSIS — T6391XA Toxic effect of contact with unspecified venomous animal, accidental (unintentional), initial encounter: Secondary | ICD-10-CM | POA: Insufficient documentation

## 2009-03-15 ENCOUNTER — Telehealth (INDEPENDENT_AMBULATORY_CARE_PROVIDER_SITE_OTHER): Payer: Self-pay | Admitting: *Deleted

## 2010-01-04 ENCOUNTER — Telehealth (INDEPENDENT_AMBULATORY_CARE_PROVIDER_SITE_OTHER): Payer: Self-pay | Admitting: *Deleted

## 2010-02-10 IMAGING — US US ABDOMEN COMPLETE
1 series · 14 of 25 positions shown · non-contrast
Comparison: [DATE] of seven

CLINICAL DATA: Abdominal pain.

ABDOMEN ULTRASOUND
TECHNIQUE: Complete abdominal ultrasound examination was performed
including evaluation of the liver, gallbladder, bile ducts,
pancreas, kidneys, spleen, IVC, and abdominal aorta.

[Series 1: us abdomen complete · 0.32mm/px · 14 of 75 slices shown]
[im 1/75]
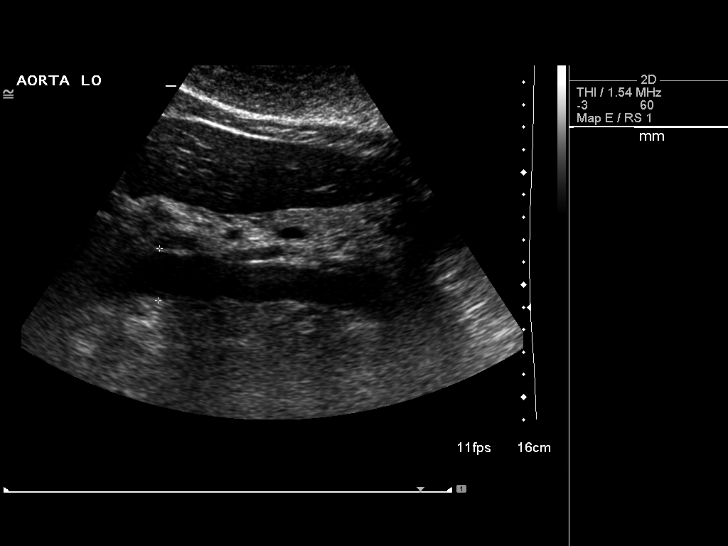
[im 7/75]
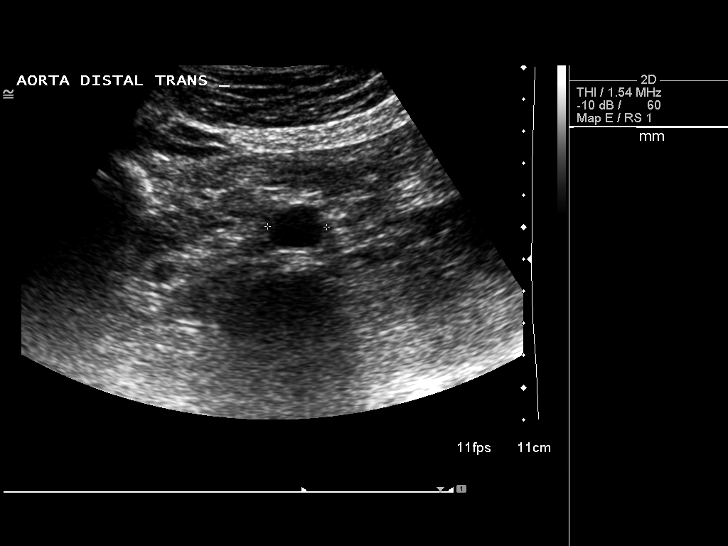
[im 13/75]
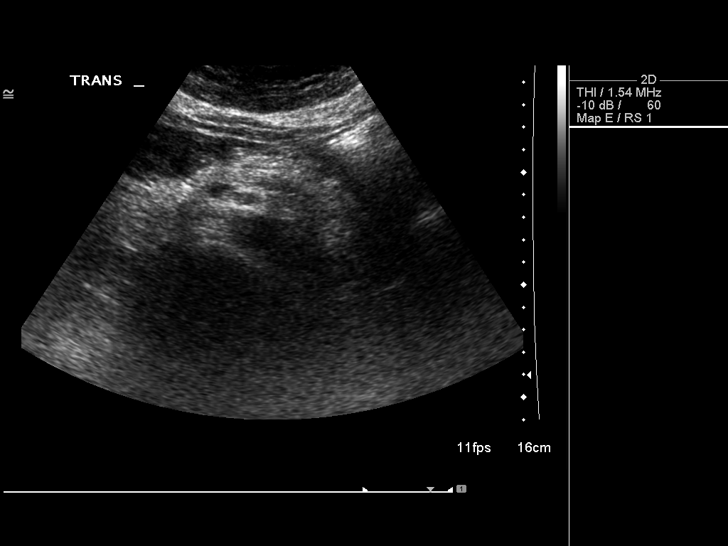
[im 19/75]
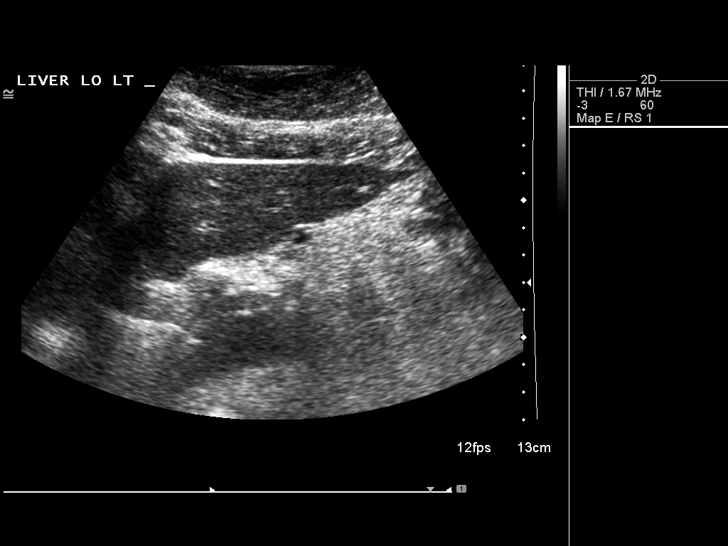
[im 25/75]
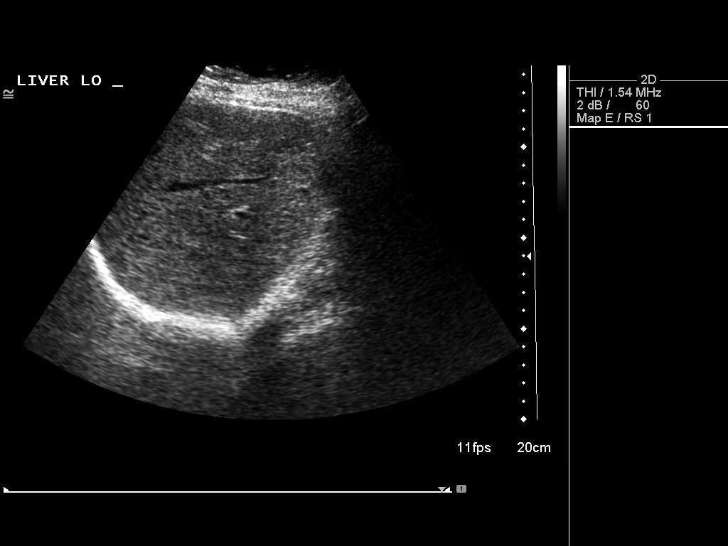
[im 28/75]
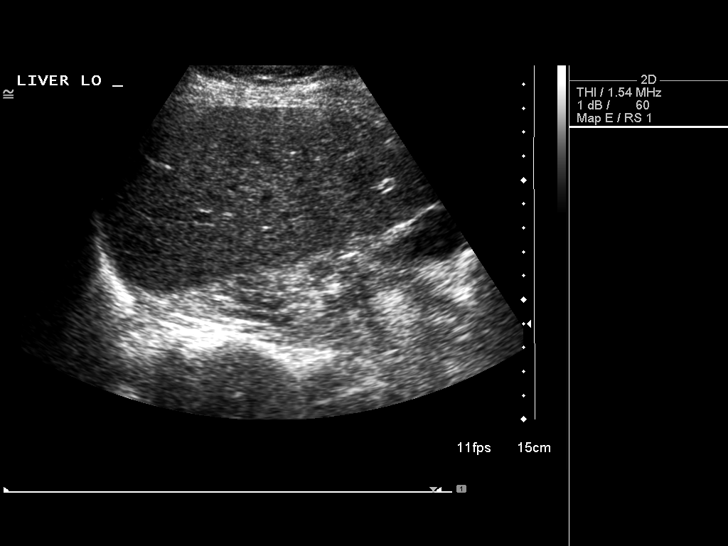
[im 34/75]
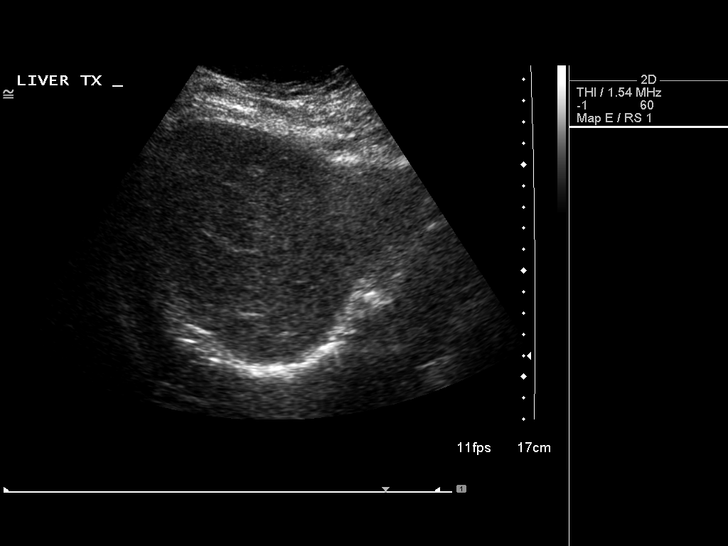
[im 41/75]
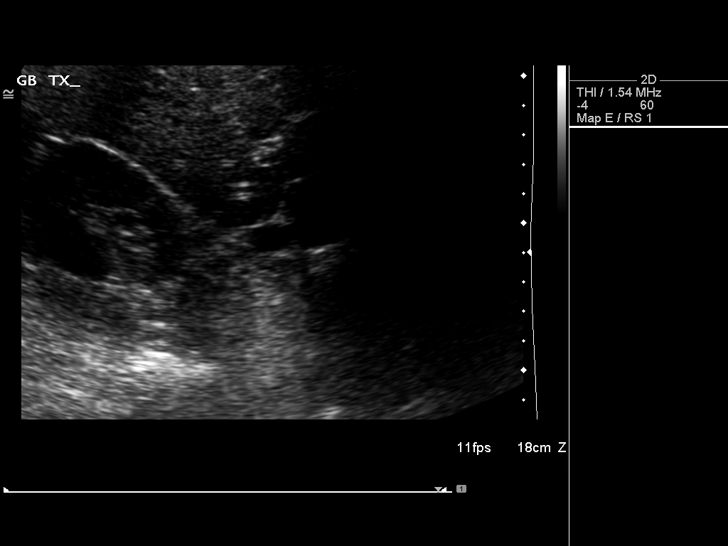
[im 47/75]
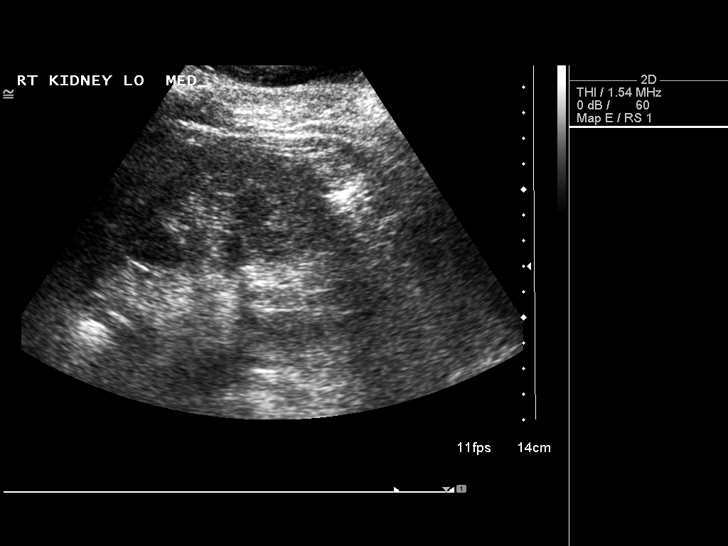
[im 50/75]
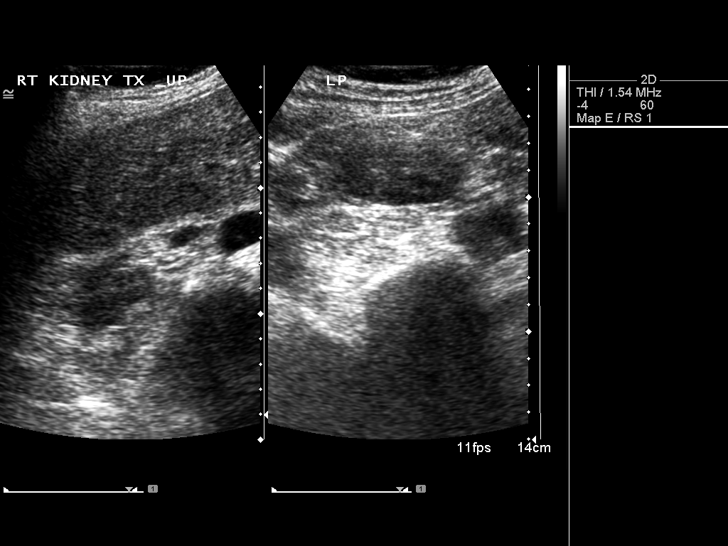
[im 56/75]
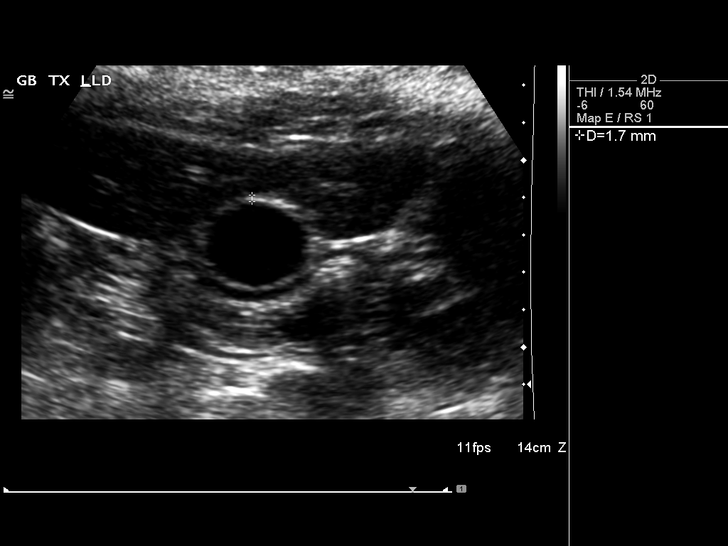
[im 62/75]
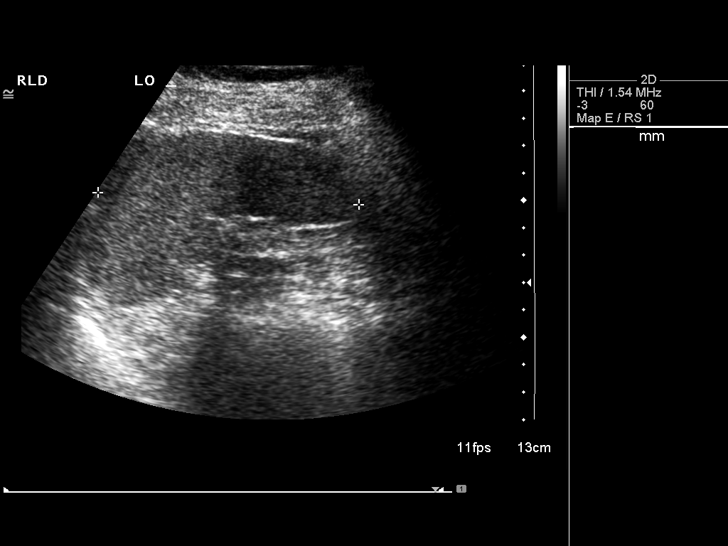
[im 68/75]
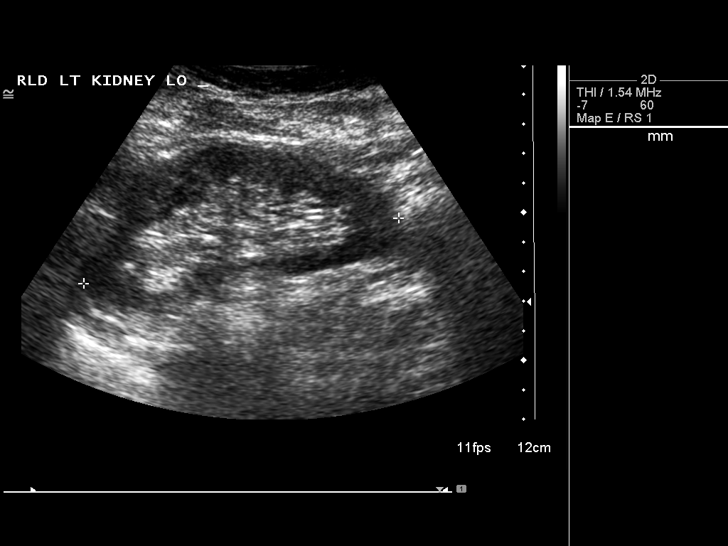
[im 75/75]
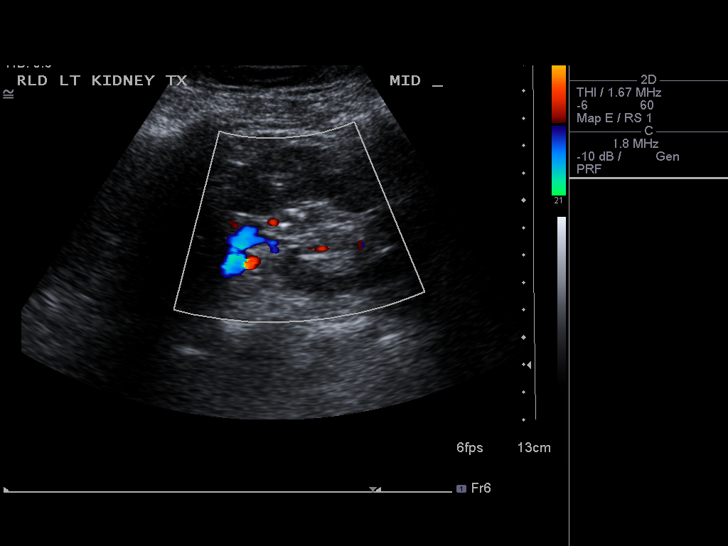

[14 of 25 positions shown; findings below may reference images not displayed]

FINDINGS: Liver, gallbladder, extrahepatic bile duct, IVC,
visualized portion of the pancreas, spleen, kidneys and aorta are
unremarkable. Pancreas was partially visualized due to bowel gas.
IMPRESSION: No acute findings.

## 2010-04-15 ENCOUNTER — Encounter: Payer: Self-pay | Admitting: Gastroenterology

## 2010-04-24 ENCOUNTER — Encounter (INDEPENDENT_AMBULATORY_CARE_PROVIDER_SITE_OTHER): Payer: Self-pay | Admitting: *Deleted

## 2010-06-25 ENCOUNTER — Encounter (INDEPENDENT_AMBULATORY_CARE_PROVIDER_SITE_OTHER): Payer: Self-pay | Admitting: *Deleted

## 2010-06-25 ENCOUNTER — Ambulatory Visit: Payer: Self-pay | Admitting: Gastroenterology

## 2010-06-25 DIAGNOSIS — Z860100 Personal history of colon polyps, unspecified: Secondary | ICD-10-CM | POA: Insufficient documentation

## 2010-06-28 ENCOUNTER — Encounter: Payer: Self-pay | Admitting: Gastroenterology

## 2010-06-28 ENCOUNTER — Ambulatory Visit: Payer: Self-pay | Admitting: Internal Medicine

## 2010-07-05 ENCOUNTER — Telehealth (INDEPENDENT_AMBULATORY_CARE_PROVIDER_SITE_OTHER): Payer: Self-pay | Admitting: *Deleted

## 2010-07-18 ENCOUNTER — Telehealth: Payer: Self-pay | Admitting: Gastroenterology

## 2010-07-22 ENCOUNTER — Telehealth: Payer: Self-pay | Admitting: Gastroenterology

## 2010-09-02 ENCOUNTER — Ambulatory Visit: Admit: 2010-09-02 | Payer: Self-pay | Admitting: Gastroenterology

## 2010-09-08 ENCOUNTER — Encounter: Payer: Self-pay | Admitting: Gastroenterology

## 2010-09-17 NOTE — Miscellaneous (Signed)
Summary: Bone Density  Clinical Lists Changes  Orders: Added new Test order of T-Bone Densitometry (77080) - Signed Added new Test order of T-Lumbar Vertebral Assessment (77082) - Signed 

## 2010-09-17 NOTE — Letter (Signed)
Summary: New Patient letter  [facility]  [address] [place], [place] [address]   Phone: [phone]  Fax: [phone]       04/24/2010 MRN: [id number]  Andrea Mckay [address] [place], [place]  [address]  Dear [name],  Welcome to the Gastroenterology Division at [facility].    You are scheduled to see Dr. Christella Hartigan on 06/25/2010 at 8:30AM on the 3rd floor at [facility], [address]  We ask that you try to arrive at our office 15 minutes prior to your appointment time to allow for check-in.  We would like you to complete the enclosed self-administered evaluation form prior to your visit and bring it with you on the day of your appointment.  We will review it with you.  Also, please bring a complete list of all your medications or, if you prefer, bring the medication bottles and we will list them.  Please bring your insurance card so that we may make a copy of it.  If your insurance requires a referral to see a specialist, please bring your referral form from your primary care physician.  Co-payments are due at the time of your visit and may be paid by cash, check or credit card.     Your office visit will consist of a consult with your physician (includes a physical exam), any laboratory testing he/she may order, scheduling of any necessary diagnostic testing (e.g. x-ray, ultrasound, CT-scan), and scheduling of a procedure (e.g. Endoscopy, Colonoscopy) if required.  Please allow enough time on your schedule to allow for any/all of these possibilities.    If you cannot keep your appointment, please call [phone] to cancel or reschedule prior to your appointment date.  This allows [place] the opportunity to schedule an appointment for another patient in need of care.  If you do not cancel or reschedule by 5 p.m. the business day prior to your appointment date, you will be charged a $50.00 late cancellation/no-show fee.    Thank you for choosing  [facility] for your medical needs.  We appreciate the opportunity to care for you.  Please visit [place] at our website  to learn more about our practice.                     Sincerely,                                                             The Gastroenterology Division

## 2010-09-17 NOTE — Letter (Signed)
Summary: Colonoscopy Letter  [facility]  [address] [place], [place] [address]   Phone: [phone]  Fax: [phone]      April 15, 2010 MRN: [id number]   ARORA COAKLEY [address] [place], [place]  [address]   Dear Ms. Bukowski,   According to your medical record, it is time for you to schedule a Colonoscopy. The American Cancer Society recommends this procedure as a method to detect early colon cancer. Patients with a family history of colon cancer, or a personal history of colon polyps or inflammatory bowel disease are at increased risk.  This letter has been generated based on the recommendations made at the time of your procedure. If you feel that in your particular situation this may no longer apply, please contact our office.  Please call our office at [phone] to schedule this appointment or to update your records at your earliest convenience.  Thank you for cooperating with [place] to provide you with the very best care possible.   Sincerely,  Rachael Fee, M.D.  [facility] Gastroenterology Division [phone]

## 2010-09-17 NOTE — Letter (Signed)
Summary: [facility] Instructions  [facility]  [address] [place], [place] [address]   Phone: [phone]  Fax: [phone]       Andrea Mckay    02-Mar-1946    MRN: [id number]        Procedure Day /Date:09/02/2010  MON     Arrival Time:730 am     Procedure Time:830 am     Location of Procedure:                    X  [facility] (4th Floor)                        PREPARATION FOR COLONOSCOPY WITH MOVIPREP   Starting 5 days prior to your procedure 08/28/10 do not eat nuts, seeds, popcorn, corn, beans, peas,  salads, or any raw vegetables.  Do not take any fiber supplements (e.g. Metamucil, Citrucel, and Benefiber).  THE DAY BEFORE YOUR PROCEDURE         DATE: 09/01/10  DAY: SUN  1.  Drink clear liquids the entire day-NO SOLID FOOD  2.  Do not drink anything colored red or purple.  Avoid juices with pulp.  No orange juice.  3.  Drink at least 64 oz. (8 glasses) of fluid/clear liquids during the day to prevent dehydration and help the prep work efficiently.  CLEAR LIQUIDS INCLUDE: Water Jello Ice Popsicles Tea (sugar ok, no milk/cream) Powdered fruit flavored drinks Coffee (sugar ok, no milk/cream) Gatorade Juice: apple, white grape, white cranberry  Lemonade Clear bullion, consomm, broth Carbonated beverages (any kind) Strained chicken noodle soup Hard Candy                             4.  In the morning, mix first dose of MoviPrep solution:    Empty 1 Pouch A and 1 Pouch B into the disposable container    Add lukewarm drinking water to the top line of the container. Mix to dissolve    Refrigerate (mixed solution should be used within 24 hrs)  5.  Begin drinking the prep at 5:00 p.m. The MoviPrep container is divided by 4 marks.   Every 15 minutes drink the solution down to the next mark (approximately 8 oz) until the full liter is complete.   6.  Follow completed prep with 16 oz of clear liquid of your choice (Nothing red or  purple).  Continue to drink clear liquids until bedtime.  7.  Before going to bed, mix second dose of MoviPrep solution:    Empty 1 Pouch A and 1 Pouch B into the disposable container    Add lukewarm drinking water to the top line of the container. Mix to dissolve    Refrigerate  THE DAY OF YOUR PROCEDURE      DATE: 09/02/2010  DAY: MON  Beginning at 330 a.m. (5 hours before procedure):         1. Every 15 minutes, drink the solution down to the next mark (approx 8 oz) until the full liter is complete.  2. Follow completed prep with 16 oz. of clear liquid of your choice.    3. You may drink clear liquids until 630 am (2 HOURS BEFORE PROCEDURE).   MEDICATION INSTRUCTIONS  Unless otherwise instructed, you should take regular prescription medications with a small sip of water   as early as possible the morning of your procedure.  OTHER INSTRUCTIONS  You will need a responsible adult at least 66 years of age to accompany you and drive you home.   This person must remain in the waiting room during your procedure.  Wear loose fitting clothing that is easily removed.  Leave jewelry and other valuables at home.  However, you may wish to bring a book to read or  an iPod/MP3 player to listen to music as you wait for your procedure to start.  Remove all body piercing jewelry and leave at home.  Total time from sign-in until discharge is approximately 2-3 hours.  You should go home directly after your procedure and rest.  You can resume normal activities the  day after your procedure.  The day of your procedure you should not:   Drive   Make legal decisions   Operate machinery   Drink alcohol   Return to work  You will receive specific instructions about eating, activities and medications before you leave.    The above instructions have been reviewed and explained to me by   _______________________    I fully understand and can verbalize these  instructions _____________________________ Date _________

## 2010-09-17 NOTE — Progress Notes (Signed)
Summary: RESULTS 11/18,11/21,11/22  Phone Note Outgoing Call   Call placed by: Almeta Monas CMA Duncan Dull),  July 05, 2010 3:02 PM Call placed to: Patient Details for Reason: + osteopenic----  con't premarin--- take calcium 1200-1500 mg daily with vita D3 1000u daily recheck 2 years  Summary of Call: Left message to call back.... Almeta Monas CMA Duncan Dull)  July 05, 2010 3:01 PM  tried calling pt....Marland KitchenMarland KitchenLine busy... Almeta Monas CMA Duncan Dull)  July 08, 2010 5:05 PM   Follow-up for Phone Call        tried calling patient unable to reach line busy .......Marland KitchenDoristine Devoid CMA  July 09, 2010 1:59 PM   will mail information to patient .......Marland KitchenDoristine Devoid CMA  July 16, 2010 8:53 AM

## 2010-09-17 NOTE — Progress Notes (Signed)
Summary: Triage   Phone Note Call from Patient Call back at Home Phone [phone]   Caller: Patient Call For: Dr. Christella Hartigan Reason for Call: Talk to Nurse Summary of Call: cannot take Moviprep....it is the same thing as Colyte it makes her "kidneys stop working" Initial call taken by: Karna Christmas,  July 18, 2010 4:03 PM  Follow-up for Phone Call        Patient  reports in the past that she has had vomiting immediately with Miralax prep, and had "kidney shutdown" for a week after taking colyte.  She does not want to take Moviprep.  She can't take  miralax either.  Dr Christella Hartigan please advise. Follow-up by: Darcey Nora RN, CGRN,  July 18, 2010 4:51 PM  Additional Follow-up for Phone Call Additional follow up Details #1::        Pt called this am says she spoke with the pharmacy and they are suggesting that we calli n "supret" for her as a prep, it is something new that has nothing in it that she allergic to, wants to know if that would be ok? Additional Follow-up by: [place] Johnson,  July 19, 2010 9:24 AM    Additional Follow-up for Phone Call Additional follow up Details #2::    what is she "allergic" to in the moveprep?   Follow-up by: Rachael Fee MD,  July 19, 2010 9:57 AM  Additional Follow-up for Phone Call Additional follow up Details #3:: Details for Additional Follow-up Action Taken: Patient denies being allergic to it. She states she has "high mercury and lead level in her body." Colyte shut down her kidneys according to patient. When asked about renal insufficiency, patient denies. Patient says she cannot take the moviprep. Patient asked about fasting for 2 days if this would be an adequate prep. Discussed multiple options with patient and according to her none of them were acceptable. Please advise. Darcey Nora, RN, CGRN    I left message at her home phone for her to call back to discuss prep options. Rachael Fee MD  July 22, 2010 12:57 PM

## 2010-09-17 NOTE — Progress Notes (Signed)
   Phone Note Outgoing Call   Summary of Call: i called to clarify her prep questions.  Colytely caused Nausea.  Miralax caused "kidneys to shut down," per patient.  By this she means she didn't urinate much the week following the colonoscopy. She never had blood tested to see if she had renal insufficienty or renal failure.  Will stick with  the moviprep which was already called in. Initial call taken by: Rachael Fee MD,  July 22, 2010 1:06 PM

## 2010-09-17 NOTE — Progress Notes (Signed)
Summary: Refill Request  Phone Note Refill Request Call back at [phone] Message from:  Pharmacy on Jan 04, 2010 1:57 PM  Refills Requested: Medication #1:  EPIPEN 0.3 MG/0.3ML (1:1000) DEVI as directed.   Dosage confirmed as above?Dosage Confirmed [facility]  Next Appointment Scheduled: none Initial call taken by: Harold Barban,  Jan 04, 2010 1:58 PM    Prescriptions: EPIPEN 0.3 MG/0.3ML (1:1000) DEVI (EPINEPHRINE HCL (ANAPHYLAXIS)) as directed  #1 x 1   Entered by:   Army Fossa CMA   Authorized by:   Loreen Freud DO   Signed by:   Army Fossa CMA on 01/04/2010   Method used:   Electronically to        [facility]* (retail)       [address]       [place], [place]  [id number]       Ph: [phone]       Fax: [phone]   RxID:   [id number]

## 2010-09-17 NOTE — Assessment & Plan Note (Signed)
Allergies Added:   History of Present Illness Visit Type: Initial Visit Primary GI MD: Rob Bunting MD Primary Provider: Loreen Freud, DO Chief Complaint: Colon screening History of Present Illness:      very pleasant 65 year old woman whom I last saw about 3 or 4 years ago.  She had a colonoscopy with Dr. Elnoria Howard 5 years ago, a 6 mm polyp was removed from her cecum, she was told a repeat colonoscopy at 3 year interval. I change that to a five-year interval after reviewing the report, pathology. Today she told her daughter who is in her 30s has           Current Medications (verified): 1)  Premarin 0.625 Mg/gm  Crea (Estrogens, Conjugated) .... 0.5 G Pv 3x A Week 2)  Nature-Throid 130 Mg Tabs (Thyroid) .... Take 1 Tab Once Daily 3)  Epipen 0.3 Mg/0.92ml (1:1000) Devi (Epinephrine Hcl (Anaphylaxis)) .... As Directed 4)  Nitrostat 0.4 Mg Subl (Nitroglycerin) .Marland Kitchen.. 1 Tablet Under Tongue At Onset of Chest Pain; You May Repeat Every 5 Minutes For Up To 3 Doses.  Allergies (verified): 1)  ! Iodine 2)  ! Sulfa 3)  ! * Laytex 4)  ! Doxycycline 5)  ! Macrodantin 6)  ! Relafen 7)  ! * Ees 8)  ! Floxin 9)  ! Daypro 10)  ! * Actonel 11)  ! * Oxaprozine 12)  ! * Celclor 13)  ! * Omnipqgue 14)  ! * Avelox 15)  ! * Spironolactone  Family History: Family History Hypertension PGM- Colon CA Maunt- DM Family History of Arthritis Family History of Prostate CA 1st degree relative <50 M--a fib daughter with adenomatous colon polyps in her 30s  Review of Systems       Pertinent positive and negative review of systems were noted in the above HPI and GI specific review of systems.  All other review of systems was otherwise negative.   Vital Signs:  Patient profile:   65 year old female Height:      65.5 inches Weight:      183 pounds BMI:     30.10 Pulse rate:   76 / minute Pulse rhythm:   regular BP sitting:   108 / 72  (left arm) Cuff size:   regular  Vitals Entered By:  June McMurray CMA Duncan Dull) (June 25, 2010 8:24 AM)  Physical Exam  Additional Exam:  Constitutional: generally well appearing Psychiatric: alert and oriented times 3 Abdomen: soft, non-tender, non-distended, normal bowel sounds    Impression & Recommendations:  Problem # 1:  Personal history of adenomatous polyps, family history of first-degree relative with adenomatous polyps in her 30s we will proceed with colonoscopy at her soonest convenience and then she should be followed with surveillance colonoscopy every 5 years given her risk factors noted above.  Patient Instructions: 1)  You will be scheduled to have a colonoscopy for personal history of pre-cancerous polyps and family history of daughter pre-cancerous polyps at young age. 2)  A copy of this information will be sent to Dr. Laury Axon. 3)  The medication list was reviewed and reconciled.  All changed / newly prescribed medications were explained.  A complete medication list was provided to the patient / caregiver.  Appended Document: Orders Update/MOVI    Clinical Lists Changes  Problems: Added new problem of PERSONAL HISTORY OF COLONIC POLYPS (ICD-V12.72) Medications: Added new medication of MOVIPREP 100 GM  SOLR (PEG-KCL-NACL-NASULF-NA ASC-C) As per prep instructions. -  Signed Rx of MOVIPREP 100 GM  SOLR (PEG-KCL-NACL-NASULF-NA ASC-C) As per prep instructions.;  #1 x 0;  Signed;  Entered by: Chales Abrahams CMA (AAMA);  Authorized by: Rachael Fee MD;  Method used: Electronically to [facility]*, [address], [place], [place]  [id number], Ph: [phone], Fax: [phone] Orders: Added new Test order of Colonoscopy (Colon) - Signed Allergies: Added new allergy or adverse reaction of * RUBBER BANDAIDES Added new allergy or adverse reaction of * CEPHLASPORINS Added new allergy or adverse reaction of * QUINOLONES Added new allergy or adverse reaction of * TETRACYCLINES Added new allergy or adverse reaction  of SYNTHROID Added new allergy or adverse reaction of ARMOUR THYROID Added new allergy or adverse reaction of * GLUTEN Added new allergy or adverse reaction of * NYLON SUTURE MATERIAL    Prescriptions: MOVIPREP 100 GM  SOLR (PEG-KCL-NACL-NASULF-NA ASC-C) As per prep instructions.  #1 x 0   Entered by:   Chales Abrahams CMA (AAMA)   Authorized by:   Rachael Fee MD   Signed by:   Chales Abrahams CMA (AAMA) on 06/25/2010   Method used:   Electronically to        [facility]* (retail)       [address]       [place], [place]  [id number]       Ph: [phone]       Fax: [phone]   RxID:   [id number]

## 2010-09-17 NOTE — Progress Notes (Signed)
Summary: Prep  Medications Added ZOFRAN 4 MG TABS (ONDANSETRON HCL) take 1 tablet by mouth 2 hours prior to starting prep, take another tablet the next morning 2 hours before prep       Phone Note Call from Patient Call back at Home Phone [phone] Call back at [phone]   Caller: Patient Call For: Dr. Christella Hartigan Reason for Call: Talk to Nurse Summary of Call: Pt calling back about movieprep, she has been out of town and turned of the power to her cell phone Initial call taken by: [place] Johnson,  July 18, 2010 11:00 AM  Follow-up for Phone Call        left message on machine to call back Chales Abrahams CMA Duncan Dull)  July 18, 2010 11:32 AM   pt had immediate vomiting with the miralax prep and wants to have Zofran called in to take while doing the prep.  Can we send? Follow-up by: Chales Abrahams CMA Duncan Dull),  July 18, 2010 11:44 AM  Additional Follow-up for Phone Call Additional follow up Details #1::        yes, have her take 4mg  zofran pill about 2 hours before starting the prep and have another 4mg  pill to take prior to AM dose Additional Follow-up by: Rachael Fee MD,  July 18, 2010 12:32 PM    Additional Follow-up for Phone Call Additional follow up Details #2::    Spoke with patient and let her know we are calling in Zofran 4mg  for her to take 2 hours prior to starting prep and again in the morning 2 hours prior to prep. Patient aware Selinda Michaels, RN  New/Updated Medications: ZOFRAN 4 MG TABS (ONDANSETRON HCL) take 1 tablet by mouth 2 hours prior to starting prep, take another tablet the next morning 2 hours before prep Prescriptions: ZOFRAN 4 MG TABS (ONDANSETRON HCL) take 1 tablet by mouth 2 hours prior to starting prep, take another tablet the next morning 2 hours before prep  #2 x 0   Entered by:   Darcey Nora RN, CGRN   Authorized by:   Rachael Fee MD   Signed by:   Darcey Nora RN, CGRN on 07/18/2010   Method used:   Electronically to        [facility]* (retail)       [address]       [place], [place]  [id number]       Ph: [phone]       Fax: [phone]   RxID:   [id number]

## 2010-10-23 ENCOUNTER — Other Ambulatory Visit: Payer: Self-pay | Admitting: Dermatology

## 2010-12-31 NOTE — Assessment & Plan Note (Signed)
OFFICE VISIT   Kronk, Tonjua C  DOB:  May 08, 1946                                       05/22/2008  ZOXWR#:[id number]   REASON FOR VISIT:  Evaluate leg swelling.   HISTORY:  This is a 65 year old female with history of bilateral leg  swelling as well as burning in her right lateral leg.  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  She most recently had an ultrasound at [facility], which  showed she had valvular insufficiency.  The patient states that she  switched thyroid medicine approximately 1 month ago and, since that  time, she has not had any issues of swelling or pain.  She states that  she has even recently taken a long car trip, and did not have any  problems whatsoever.  She does have some stockings that she obtained  about 10 years ago, that she does wear on occasion.  Again, the leg pain  and burning and swelling that has been complaining of, has nearly  resolved at this time.  It has not bothered her for about a month, which  correlates with stopping her thyroid medicine.   REVIEW OF SYSTEMS:  GENERAL:  Negative fevers, chills weight gain weight  loss.  CARDIAC:  Positive mitral valve prolapse.  PULMONARY:  Negative.  GI:  Negative.  GU:  Negative.  VASCULAR:  Negative.  NEURO:  Negative.  ORTHO:  Negative.  ENT:  Negative.  PSYCH:  Negative.  HEME:  Negative.   PAST MEDICAL HISTORY:  Hypercholesterolemia and leg swelling.   FAMILY HISTORY:  Noncontributory.   SOCIAL HISTORY:  She is single with 3 children, does not smoke.  Has  never smoked.  Does not drink.   MEDICATIONS:  Please see medical record.   ALLERGIES:  Macrodantin, Omnipaque, iodine, latex, sulfa, Avelox,  Actonel, doxycycline, EES, Floxin, Ceclor, Daypro, Relafen.   PHYSICAL EXAMINATION:  Blood pressure is 117/71, pulse 72.  She is well  appearing, no distress.  Cardiovascular:  Rhythm rate and rhythm.  Respirations nonlabored.  Extremities are warm, well perfused.  She has  minimal edema bilaterally.  She does have medial varicosities in her  right thigh.  There is no hyperpigmentation.   ASSESSMENT/PLAN:  Leg swelling, with minimal symptoms,   PLAN:  I recommended the patient be fitted for thigh-high compression to  help with her symptoms.  This will be 20-30 mm gradient.  She can come  back to see me in 3 months.  We can evaluate how she is doing and see if  she would be a candidate for endovenous laser ablation.  I do not have a  copy of her venous ultrasound at this time.  I am having this faxed to  my office.  It sounds as if she may have deep system insufficiency,  which would mean that she would require treatment with compression  stockings.  If, however, she does have superficial insufficiency, we can  consider endovenous ablation.  However, her symptoms are not bad enough  at this time.  She will call me if she has any problems.   Jorge Ny, MD  Electronically Signed   VWB/MEDQ  D:  05/22/2008  T:  05/23/2008  Job:  [id number]

## 2010-12-31 NOTE — Assessment & Plan Note (Signed)
[facility]                            CARDIOLOGY OFFICE NOTE   Andrea Mckay, Andrea Mckay                     MRN:          [id number]  DATE:08/25/2008                            DOB:          06/12/1946    PRIMARY CARE PHYSICIAN:  Lelon Perla, DO   CHIEF COMPLAINT:  Chest pain.   CLINICAL HISTORY:  Zion Lint is 65 year old and came in for an  unscheduled visit for evaluation of chest pain.  She had previously been  a patient of Dr. Graceann Congress but had been turned back over to Dr.  Loreen Freud who is her primary care physician.  Over the last several  months, she has been having chest pains which she describes as a sharp  shooting pain which occur almost everyday.  These are not related to  activity or stress or eating.  She relates them to adjustment in her  thyroid medicine.  She had previously been on a natural thyroid  medication, but this was discontinued, and Dr. Talmage Nap who has been  managing her thyroid has had her on Synthroid and Cytomel.  She feels  that the chest pains have been related to the change in her thyroid  medication.  She has had no associated palpitations or shortness of  breath.  Her symptoms got worse yesterday and she came into our office  today.   Her past medical history is significant for hyperlipidemia.  We do not  have her lipid values.  She also has a history of mitral valve prolapse  on echocardiogram, although her last echocardiogram in 2007 showed mild  mitral regurg with no prolapse.  She also has a history of  hypothyroidism.   Her current medications include Armour Thyroid which she just recently  started back from an old prescription that she had.  She also takes a  baby aspirin and vitamins.   Family history is positive for heart disease.  Both maternal and  paternal grandmothers died of MIs in their 9s.  Her mother has atrial  fibrillation.   SOCIAL HISTORY:  She is single.  She does have 3  grown boys.  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  She does not smoke.   Review of systems is positive for recent diarrhea for which she has seen  Dr. Laury Axon.  She also has had some swelling for which she has seen Dr.  Durene Cal in her lower extremities.   On examination today, blood pressure was 142/78 and the pulse 92 and  regular.  There was no venous distension.  The carotid pulses were full,  and there were no bruits.  The chest was clear without rales or rhonchi.  Cardiac rhythm was regular.  The heart sounds were normal, and there  were no murmurs or gallops.  The abdomen was soft without organomegaly.  Bowel sounds were normal.  There is no hepatosplenomegaly.  Peripheral  pulses were full and there was no peripheral edema.  Musculoskeletal  system showed no deformities.  The skin was warm and dry.  Neurological  examination showed no focal  neurological signs.   IMPRESSION:  1. Chest pain of uncertain etiology, atypical for ischemia.  2. Hyperlipidemia.  3. History of mitral valve prolapse.  4. Hypothyroidism.  5. History of edema of the lower extremities.   RECOMMENDATIONS:  Ms. Godinho's symptoms are very atypical for ischemia.  Electrocardiogram was normal.  We will plan to evaluate her further with  an echocardiogram to reevaluate for mitral valve prolapse.  She has had  a positive family history of a coronary heart disease, and since she has  a history of hyperlipidemia we will get a rest stress Myoview scan.  We  will get laboratory work today.  She is fasting and we will get a  fasting lipid and liver, CBC, BMP, magnesium level, and TSH.  I will see  her back in followup in a couple of weeks to follow up on the results of  these tests.     Bruce Elvera Lennox Juanda Chance, MD, [facility]  Electronically Signed    BRB/MedQ  DD: 08/25/2008  DT: 08/25/2008  Job #: [id number]   cc:   Lelon Perla, DO  Dorisann Frames, M.D.

## 2010-12-31 NOTE — Assessment & Plan Note (Signed)
[facility]                            CARDIOLOGY OFFICE NOTE   Andrea Mckay, Andrea Mckay                     MRN:          [id number]  DATE:09/07/2008                            DOB:          [birth year]    CLINICAL HISTORY:  Andrea Mckay is a 65 year old and returned for followup  evaluation of chest pain.  She recently developed chest pains and it  temporarily seemed to be related to changes in her thyroid medicine.  We  evaluated her with a rest/stress Myoview scan and echocardiogram.  The  Myoview scan was negative for ischemia and the echocardiogram showed  some flattening of the mitral valve suggestive of prolapse.  Also, the  laboratory studies, which showed a low TSH and a very high LDL of 180.   Since she went back on her Nature-Throid, she has felt much better and  the chest pains have been much less pronounced.   Her risk factor for coronary artery disease is mainly due to high  elevated LDL and a positive family history of heart attacks in their 14s  of paternal and maternal grandparents.   Her current medication includes Nature-Throid.   PHYSICAL EXAMINATION:  VITAL SIGNS:  Blood pressure is 114/66 and pulse  82 and regular.  NECK:  There was no venous distension.  Carotid pulses were full without  bruits.  CHEST:  Clear.  HEART:  Rhythm was regular.  No murmurs or gallops.  ABDOMEN:  Soft without organomegaly.  EXTREMITIES:  Peripheral pulses were full with no peripheral edema.   IMPRESSION:  1. Chest pain of uncertain etiology, probably noncardiac.  2. Abnormal thyroid function tests with evidence of iatrogenic      hyperthyroidism.  3. Mild mitral valve prolapse.  4. Hyperlipidemia with elevated LDL.   RECOMMENDATIONS:  I think Andrea Mckay does not have any evidence of  ischemic heart disease or underlying organic heart disease except for  mild mitral valve prolapse.  She seems to be better since she changed  her thyroid medicine.   She plans to see Dr. Talmage Nap back to decide about  long-term therapy and addressing the low TSH.  I think her elevated LDL  can be  managed with diet since her risk profile is still fairly low.  I will  plan to see her back on a p.r.n. basis.     Bruce Elvera Lennox Juanda Chance, MD, [facility]  Electronically Signed    BRB/MedQ  DD: 09/07/2008  DT: 09/08/2008  Job #: [id number]   cc:   Andrea Mckay, M.D.  Lelon Perla, DO

## 2011-01-03 NOTE — Assessment & Plan Note (Signed)
[facility]                           GASTROENTEROLOGY OFFICE NOTE   [name], [name]                     MRN:          [id number]  DATE:05/11/2006                            DOB:          1945-12-11    REASON FOR REFERRAL:  Dr. Laury Axon asked me to evaluate Andrea Mckay in  consultation regarding intermittent right upper back pain.   HISTORY OF PRESENT ILLNESS:  Andrea Mckay is a pleasant 65 year old woman who  has had three episodes of right upper back discomfort. She says at least  once this happened after eating, but another episode woke her up from  sleeping. She describes the pain as a gnawing severe pain that can last 2 to  3 days. Lying over a large ball does seem to improve her pain. She has had  no fevers or chills but experiences some nausea during the pain. Work up to  date has included multiple tests. Liver tests last month were completely  normal (it is not clear if this was done during her pain).  She tells me she  had an ultrasound recently that was completely normal.  I have a CT scan  report from February, 2006 that was essentially normal. She has had a recent  barium enema ordered by another gastroenterologist, Dr. Charna Elizabeth for  intermittent dysphagia. Esophagogram was normal. She had a colonoscopy with  Dr. 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  She is scheduled for follow up with them.  Between these episodes of pain  she has no problems, taking Aleve during the pain as well as some  antioxidants which does seem to help the discomfort as well as lying on the  ball as described above.   REVIEW OF SYSTEMS:  Notable for a 20 pound weight loss in the past year or  so. This is following a 50 pound weight gain in the past 3-4 years. She has  made some dietary improvements and believes that is the cause of her weight  loss. No hematemesis, hematochezia, no dysphagia. The rest of her review of  systems  is essentially normal and is available on the nurse intake sheet.   PAST MEDICAL HISTORY:  1. Fibromyalgia.  2. Elevated cholesterol.  3. Low thyroid.  4. History of kidney stones.  5. Status post hysterectomy.  6. Status post tubal ligation.   CURRENT MEDICATIONS:  She takes vitamins and herbs on a nearly daily basis  but somewhat sporadically. Other medication omothyroid.   ALLERGIES:  LATEX, OMNIPAQUE, IODINE, SULFA, AVELOX, ACTONEL.   SOCIAL HISTORY:  Single. Three children. Works as an Airline pilot. Non smoker.  Drinks approximately once a week.   FAMILY HISTORY:  An aunt with colon cancer. Colon polyps in her daughter and  paternal grandfather.   PHYSICAL EXAMINATION:  VITAL SIGNS:  Height is 5 feet, 7 inches. Weight is  174 pounds. Blood pressure 112/70, pulse 64.  GENERAL:  Well-appearing.  NEUROLOGIC:  Alert and oriented x3.  HEENT:  Extraocular movements are intact. Oropharynx moist, no lesions.  NECK:  Supple, no lymphadenopathy.  CARDIOVASCULAR:  Regular rate and rhythm.  LUNGS:  Clear to auscultation bilaterally.  ABDOMEN:  Soft, nontender, nondistended. Normal bowel sounds. No  costovertebral tenderness on either side.  EXTREMITIES:  No lower extremity edema.  SKIN:  No rash or lesions on visible extremities.   ASSESSMENT AND PLAN:  A 65 year old woman with right upper back pain  intermittently.  Liver tests, ultrasound and CT scan seem to exonerate her liver and biliary  tree. This pain, certainly its location and character, would be quite  unusual for GI or hepatobiliary disease.  I thought that endoscopy would  probably be the last test I would recommend and she was not so interested in  that right now.  I do think a good idea would be to get a repeat CBC and  complete metabolic profile during her next acute episode of pain. I have  written a prescription for this and she will have that done and the results  faxed to me during her next episode of pain.    Other possibilities include kidney stones. She did not have costovertebral  tenderness however this could be considered with her next episode of pain as  well. The best way to image that would be a non contrast CT scan.   She has already been seen several times by Dr. Charna Elizabeth and her  associates for other GI processes. Most importantly she had a colon polyp  removed and has colon cancer in her family and follow up has been arranged  for colon cancer screening with them.                                   Rachael Fee, MD   DPJ/MedQ  DD:  05/11/2006  DT:  05/12/2006  Job #:  [id number]   cc:   Loreen Freud, M.D.

## 2011-01-03 NOTE — Assessment & Plan Note (Signed)
[facility]                              CARDIOLOGY OFFICE NOTE   Andrea Mckay, Andrea Mckay                     MRN:          [id number]  DATE:06/17/2006                            DOB:          [birth year]    Andrea Mckay is a very pleasant 65 year old white female with history of  hyperlipidemia, prediabetes, mitral valve prolapse seen for evaluation in  May 2007.   The patient with some atypical chest pain and palpitations.  She has had no  recurrent symptoms.  She has a history of hypothyroidism.  She is now on  thyroid and vitamins.  She was started on aspirin but has discontinued it.   Blood pressure 110/64, pulse 75 normal sinus rhythm.  General appearance  normal.  JVP is not elevated.  Carotid pulses bilaterally equal without  bruits.  Lungs are clear.  Cardiac exam has no murmur or click.  Extremities  are normal.  Recent LDL was 163.   IMPRESSION:  1. Hyperlipidemia.  2. History of mitral valve prolapse though none noted on echocardiogram      and no click or murmur heard today.  3. Hypothyroidism.   I have suggested the patient start on a statin.  However, she is quite  reluctant and would like to have it repeated on the diet.   The patient also states she takes 81 mg of aspirin daily.  Will check her  lipids in 6 weeks.  She will see Dr.  Laury Axon in followup and return here on a  p.r.n. basis.    ______________________________  E. Graceann Congress, MD, [facility]    EJL/MedQ  DD: 06/17/2006  DT: 06/17/2006  Job #: [id number]   cc:   Lelon Perla, DO

## 2011-01-03 NOTE — H&P (Signed)
[facility]  Patient:    Andrea Mckay, Andrea Mckay Visit Number: [id number] MRN: [id number]          Service Type: MED Location: 3W [id number] Attending Physician:  Learta Codding Dictated by:   Lewayne Bunting, M.D. [facility] Admit Date:  10/09/2001   CC:         Carola J. Gerri Spore, M.D.  Cecil Cranker, M.D. [facility]   History and Physical  REFERRING PHYSICIAN:  Carola J. Gerri Spore, M.D.  CARDIOLOGIST:  Cecil Cranker, M.D.  CHIEF COMPLAINT:  Left-sided chest pain associated with dizziness.  HISTORY OF PRESENT ILLNESS:  The patient is a 65 year old female, with no prior cardiac history. She had been seen at some point in the past by Dr. Glennon Hamilton for dysrhythmias and was previously taking beta blocker therapy, although she has not taken any recently. The patient a history of hypothyroidism but no tobacco history. She is now being admitted after she reported substernal chest pain earlier today. The patient was working in the attic when she suddenly felt "funny." She became presyncopal similar to problems she has had with positional vertigo in the past. The patient did not lose consciousness and was able to get a hold of a side rail and then ultimately went downstairs. When she got sat stairs she felt a sharp, knifelike pain in the left chest. She then called her sister in [place] and she was told to go to the emergency room. On arrival in the emergency room, the patient still reported some left-sided chest pain, which resolved with nitroglycerin. She did become hypotension with this therapy and this was discontinued.  She had no ECG changes suggestive of ischemia seen on admission.  CARDIAC RISK FACTORS:  Tobacco and hyperlipidemia.  FAMILY HISTORY:  Noncontributory.  SOCIAL HISTORY:  The patient smokes marijuana. She drinks occasional alcohol. 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 She lives alone.  PAST MEDICAL HISTORY: 1. Hypothyroidism,  status post bilateral TAH and BSO. 2. History of malaria at age 1. 3. 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 4. Pyelonephritis. 5. History of renal calculus.  MEDICATIONS:  Tylenol, numerous vitamins, and hawthorn.  ALLERGIES:  DOXYCYCLINE, ERYTHROMYCIN, SULFA, MACRODANTIN, and LATEX as well as PLASTIC TAPE.  REVIEW OF SYSTEMS:  No nausea or vomiting. No fever or chills. No orthopnea or PND. No palpitations or syncope.  PHYSICAL EXAMINATION:  VITAL SIGNS: Blood pressure 117/67, heart rate 77 beats per minute. Temperature afebrile.  GENERAL: A mildly obese white female in no apparent distress.  HEENT: Jenkins/AD. PERRLA. EOMI.  NECK: Supple. No bruits or no JVD.  LUNGS: Clear.  HEART: Regular rate and rhythm. No S1, S2.  No S3.  ABDOMEN: Soft, nontender. No rebound or guarding. Good bowel sounds.  EXTREMITIES: No clubbing, cyanosis, or edema.  NEUROLOGICAL: Alert, oriented, grossly nonfocal.  LABORATORY DATA:  CK 71, CK-MB is 18, troponin is 0.01. INR is 1.0.  D-dimer is less than 0.22. CBC is within normal limits as well as a BMET that is within normal limits.  Chest x-ray shows no acute changes. A 12-lead electrocardiogram is noticed for normal sinus rhythm with no acute abnormalities.  IMPRESSION AND PLAN: 1. Substernal chest pain, very atypical. The patient does have a history of    dyslipidemia and tobacco use, but overall appears to be a low risk.    We will perform multiple marker assessment, and BMP, CRP, and    troponins are all within normal limits. I do  feel the patient can be    discharged in the morning with an outpatient stress test to be performed in    our office later this week. No evidence of pulmonary embolus and blood    pretest probability and negative D-dimer excludes this diagnosis. 2. Hyperlipidemia: Lipid panel will be checked in the morning. 3. Counseled about marijuana use.  DISPOSITION: As outlined above, possible discharge tomorrow. Dictated  by:   Lewayne Bunting, M.D. [facility] Attending Physician:  Learta Codding DD:  10/09/01 TD:  10/09/01 Job: [id number]

## 2011-01-03 NOTE — Discharge Summary (Signed)
[facility]  Patient:    Andrea Mckay, Andrea Mckay Visit Number: [id number] MRN: [id number]          Service Type: MED Location: 3W [phone] Attending Physician:  Learta Codding Dictated by:   Tereso Newcomer, P.A.-C. Admit Date:  10/09/2001 Disc. Date: 10/10/01   CC:         Carola J. Gerri Spore, M.D.  Nuclear Medicine Lab at [facility]   Discharge Summary  DISCHARGE DIAGNOSES: 1. Chest pain, etiology unclear. 2. History of hypothyroidism:    a. Abnormal thyroid function studies this admission with a       thyroid-stimulating hormone at 0.068 and a free T4 of 0.98. 3. Status post total abdominal hysterectomy/bilateral salpingo-oophorectomy. 4. History of malaria at age 60. 5. History of hepatitis A. 6. History of renal calculi. 7. Marijuana use.  HOSPITAL COURSE:  This is a 65 year old female who was seen at [facility] on October 09, 2001, with complaints of chest pain.  Her symptoms were very atypical and her EKG showed no signs of ischemia.  Her cardiac markers were negative x2.  Her thyroid function tests were abnormal as noted above.  Given the patients negative workup.  It was felt that she was stable enough for discharge to home on October 10, 2001.  She would need follow up with Dr. Andee Lineman in the office as well as outpatient stress Cardiolite testing this week.  She has been asked to remain n.p.o. after midnight tonight for possible stress Cardiolite tomorrow morning.  LABORATORY DATA:  White blood cell count 6000, hemoglobin 12.7, hematocrit 37.5, platelet count 294,000.  INR 1.0.  D-dimer less than 0.22.  Sodium 144, potassium 4.1, chloride 111, CO2 26, glucose 102, BUN 10, creatinine 0.8. Cardiac enzymes negative x2.  Brain natriuretic peptide 53.8.  TSH 0.068, free T4 0.98.  CRP 0.1.  Urine drug screen negative.  Urinalysis negative.  DISCHARGE MEDICATIONS: 1. Coated aspirin 81 mg a day. 2. She is to continue her  medications as previously taken.  ACTIVITY:  Nothing strenuous until after her stress test.  DIET:  Low fat and low sodium.  FOLLOWUP:  She will be contacted by our office on October 11, 2001, to set up a rest stress Cardiolite study.  She will see Dr. Andee Lineman in two weeks and she has been asked to call the office for an appointment.  She should follow up with Dr. Gerri Spore in one to two weeks as well.  She is to call her for an appointment. Dictated by:   Tereso Newcomer, P.A.-C. Attending Physician:  Learta Codding DD:  10/10/01 TD:  10/10/01 Job: [id number]

## 2011-01-16 ENCOUNTER — Encounter: Payer: Self-pay | Admitting: Family Medicine

## 2011-01-20 ENCOUNTER — Other Ambulatory Visit: Payer: Self-pay | Admitting: Dermatology

## 2011-01-20 HISTORY — PX: LIPOMA EXCISION: SHX5283

## 2011-01-21 ENCOUNTER — Encounter: Payer: Self-pay | Admitting: Family Medicine

## 2011-01-21 ENCOUNTER — Ambulatory Visit (INDEPENDENT_AMBULATORY_CARE_PROVIDER_SITE_OTHER): Payer: Medicare Other | Admitting: Family Medicine

## 2011-01-21 VITALS — BP 100/70 | HR 66 | Temp 98.7°F | Resp 12 | Ht 65.75 in | Wt 186.2 lb

## 2011-01-21 DIAGNOSIS — Z85828 Personal history of other malignant neoplasm of skin: Secondary | ICD-10-CM

## 2011-01-21 DIAGNOSIS — Z136 Encounter for screening for cardiovascular disorders: Secondary | ICD-10-CM

## 2011-01-21 DIAGNOSIS — Z13220 Encounter for screening for lipoid disorders: Secondary | ICD-10-CM

## 2011-01-21 DIAGNOSIS — Z0000 Encounter for general adult medical examination without abnormal findings: Secondary | ICD-10-CM

## 2011-01-21 DIAGNOSIS — E039 Hypothyroidism, unspecified: Secondary | ICD-10-CM

## 2011-01-21 LAB — POCT URINALYSIS DIPSTICK
Leukocytes, UA: NEGATIVE
Nitrite, UA: NEGATIVE
Protein, UA: NEGATIVE
Urobilinogen, UA: 0.2
pH, UA: 6

## 2011-01-21 LAB — LIPID PANEL
Cholesterol: 256 mg/dL — ABNORMAL HIGH (ref 0–200)
HDL: 71 mg/dL (ref >39.00)
Triglycerides: 84 mg/dL (ref 0.0–149.0)

## 2011-01-21 LAB — CBC WITH DIFFERENTIAL/PLATELET
Basophils Relative: 0.3 % (ref 0.0–3.0)
Eosinophils Absolute: 0.1 10*3/uL (ref 0.0–0.7)
Eosinophils Relative: 1.3 % (ref 0.0–5.0)
HCT: 41.1 % (ref 36.0–46.0)
Lymphs Abs: 2.3 10*3/uL (ref 0.7–4.0)
MCHC: 34.1 g/dL (ref 30.0–36.0)
MCV: 89.2 fl (ref 78.0–100.0)
Monocytes Absolute: 0.4 10*3/uL (ref 0.1–1.0)
Platelets: 309 10*3/uL (ref 150.0–400.0)
WBC: 7.6 10*3/uL (ref 4.5–10.5)

## 2011-01-21 LAB — BASIC METABOLIC PANEL
BUN: 10 mg/dL (ref 6–23)
CO2: 29 mEq/L (ref 19–32)
Chloride: 106 mEq/L (ref 96–112)
Creatinine, Ser: 0.6 mg/dL (ref 0.4–1.2)
Potassium: 4.8 mEq/L (ref 3.5–5.1)

## 2011-01-21 LAB — HEPATIC FUNCTION PANEL
ALT: 31 U/L (ref 0–35)
Total Bilirubin: 0.6 mg/dL (ref 0.3–1.2)
Total Protein: 6.5 g/dL (ref 6.0–8.3)

## 2011-01-21 NOTE — Patient Instructions (Signed)

## 2011-01-21 NOTE — Progress Notes (Signed)
Subjective:     Andrea Mckay is a 65 y.o. female and is here for a comprehensive physical exam. The patient reports no problems.  History   Social History  . Marital Status: Divorced    Spouse Name: N/A    Number of Children: N/A  . Years of Education: N/A   Occupational History  . accounting P/T    Social History Main Topics  . Smoking status: Never Smoker   . Smokeless tobacco: Never Used  . Alcohol Use: No  . Drug Use: No  . Sexually Active: No   Other Topics Concern  . Not on file   Social History Narrative  . No narrative on file   Health Maintenance  Topic Date Due  . Tetanus/tdap  05/18/1964  . Mammogram  11/18/2009  . Pneumococcal Polysaccharide Vaccine Age 75 And Over  05/18/2010  . Influenza Vaccine  05/19/2011  . Colonoscopy  08/18/2014  . Zostavax  Completed    The following portions of the patient's history were reviewed and updated as appropriate: allergies, current medications, past family history, past medical history, past social history, past surgical history and problem list.  Review of Systems  Review of Systems  Constitutional: Negative for activity change, appetite change and fatigue.  HENT: Negative for hearing loss, congestion, tinnitus and ear discharge.  dentist q21m Eyes: Negative for visual disturbance (see optho q1y -- vision corrected to 20/20 with glasses).  Respiratory: Negative for cough, chest tightness and shortness of breath.   Cardiovascular: Negative for chest pain, palpitations and leg swelling.  Gastrointestinal: Negative for abdominal pain, diarrhea, constipation and abdominal distention.  Genitourinary: Negative for urgency, frequency, decreased urine volume and difficulty urinating.  Musculoskeletal: Negative for back pain, arthralgias and gait problem.  Skin: Negative for color change, pallor and rash.  Neurological: Negative for dizziness, light-headedness, numbness and headaches.  Hematological: Negative for adenopathy.  Does not bruise/bleed easily.  Psychiatric/Behavioral: Negative for suicidal ideas, confusion, sleep disturbance, self-injury, dysphoric mood, decreased concentration and agitation.  Pt is able to read and write and can do all ADLs No risk for falling No abuse/ violence in home  optho--  ?--[facility]--[facility]--[facility]--[facility]--[facility]--[facility]--[facility]-  [place] [facility] Objective:    BP 100/70  Pulse 66  Temp(Src) 98.7 F (37.1 C) (Oral)  Resp 12  Ht 5' 5.75" (1.67 m)  Wt 186 lb 3.2 oz (84.46 kg)  BMI 30.28 kg/m2  SpO2 97% General appearance: alert, cooperative, appears stated age and no distress Head: Normocephalic, without obvious abnormality, atraumatic Eyes: conjunctivae/corneas clear. PERRL, EOM's intact. Fundi benign. Ears: normal TM's and external ear canals both ears Nose: Nares normal. Septum midline. Mucosa normal. No drainage or sinus tenderness. Throat: lips, mucosa, and tongue normal; teeth and gums normal Neck: no adenopathy, no carotid bruit, no JVD, supple, symmetrical, trachea midline and thyroid not enlarged, symmetric, no tenderness/mass/nodules Lungs: clear to auscultation bilaterally Breasts: gyn Heart: regular rate and rhythm, S1, S2 normal, no murmur, click, rub or gallop Abdomen: soft, non-tender; bowel sounds normal; no masses,  no organomegaly Pelvic: gyn Extremities: extremities normal, atraumatic, no cyanosis or edema Pulses: 2+ and symmetric Skin: Skin color, texture, turgor normal. No rashes or lesions Lymph nodes: Cervical, supraclavicular, and axillary nodes normal. Neurologic: Grossly normal psych- AAOX3  NAD,  no suicidal ideation    Assessment:    Healthy female exam.   hypothyroid--- per endo Plan:    ghm utd Check labs See After Visit Summary for Counseling Recommendations

## 2011-01-22 LAB — TSH: TSH: 0.95 u[IU]/mL (ref 0.35–5.50)

## 2011-01-28 ENCOUNTER — Telehealth: Payer: Self-pay | Admitting: *Deleted

## 2011-01-28 ENCOUNTER — Ambulatory Visit (AMBULATORY_SURGERY_CENTER): Payer: Medicare Other | Admitting: *Deleted

## 2011-01-28 VITALS — Ht 66.0 in | Wt 184.0 lb

## 2011-01-28 DIAGNOSIS — Z860100 Personal history of colon polyps, unspecified: Secondary | ICD-10-CM

## 2011-01-28 MED ORDER — PEG-KCL-NACL-NASULF-NA ASC-C 100 G PO SOLR: ORAL | Status: DC

## 2011-01-28 NOTE — Telephone Encounter (Signed)
Rx for Zofran 4 mg by mouth 2 hours before evening dose of prep and Zofran 4 mg by mouth 2 hours before morning dose of prep called in to [facility].  Pt notified.  Ezra Sites

## 2011-01-28 NOTE — Telephone Encounter (Signed)
Yes, please give it to her at the same dosing described earlier.

## 2011-01-28 NOTE — Telephone Encounter (Signed)
Dr Christella Hartigan: pt cancelled colonoscopy in December 2011 and has rescheduled for 6/21.  Pt had problems with vomiting w/ prior preps.  (See phone note Centricity 12/1).  You prescribed Zofran 4 mg 2 hours prior to evening prep and Zofran 4 mg prior to morning prep.  She did not pick up Rx from pharmacy.  Would you like for her to have Zofran for upcoming colonoscopy? Ezra Sites

## 2011-01-29 ENCOUNTER — Telehealth: Payer: Self-pay | Admitting: Gastroenterology

## 2011-02-07 ENCOUNTER — Ambulatory Visit (AMBULATORY_SURGERY_CENTER): Payer: Medicare Other | Admitting: Gastroenterology

## 2011-02-07 ENCOUNTER — Encounter: Payer: Self-pay | Admitting: Gastroenterology

## 2011-02-07 VITALS — BP 124/57 | HR 65 | Resp 18 | Ht 66.0 in | Wt 184.0 lb

## 2011-02-07 DIAGNOSIS — K5730 Diverticulosis of large intestine without perforation or abscess without bleeding: Secondary | ICD-10-CM

## 2011-02-07 DIAGNOSIS — Z1211 Encounter for screening for malignant neoplasm of colon: Secondary | ICD-10-CM

## 2011-02-07 DIAGNOSIS — Z86010 Personal history of colonic polyps: Secondary | ICD-10-CM

## 2011-02-07 DIAGNOSIS — D126 Benign neoplasm of colon, unspecified: Secondary | ICD-10-CM

## 2011-02-07 DIAGNOSIS — K635 Polyp of colon: Secondary | ICD-10-CM

## 2011-02-07 MED ORDER — SODIUM CHLORIDE 0.9 % IV SOLN: 500.0000 mL | INTRAVENOUS | Status: DC

## 2011-02-07 NOTE — Patient Instructions (Signed)
Discharge instructions given with verbal understanding. Handouts on polyps and diverticulosis given. Also high fiber diet. Resume previous medications. 

## 2011-02-07 NOTE — Progress Notes (Signed)
Pt is a diabetic.  Does not take any medication, controls sugars with diet and exercise only.  Does not check sugars

## 2011-02-10 ENCOUNTER — Telehealth: Payer: Self-pay

## 2011-02-10 NOTE — Telephone Encounter (Signed)

## 2011-04-01 ENCOUNTER — Ambulatory Visit: Payer: Medicare Other | Admitting: Family Medicine

## 2011-04-11 ENCOUNTER — Ambulatory Visit (INDEPENDENT_AMBULATORY_CARE_PROVIDER_SITE_OTHER): Payer: Medicare Other | Admitting: *Deleted

## 2011-04-11 DIAGNOSIS — Z23 Encounter for immunization: Secondary | ICD-10-CM

## 2011-04-17 ENCOUNTER — Telehealth: Payer: Self-pay | Admitting: Cardiology

## 2011-04-17 NOTE — Telephone Encounter (Signed)
Patient was seen by Dr Juanda Chance in 08/2008 and a stress test and echo we ordered  Both of which were normal  He aid after to see patient back on an as needed basis  Patient is calling today after seeing Dr Talmage Nap and says that she told her she may need another stress test  Patient has been packing up boxes as she is going to be moving in 2 months  She is very active and follows a strict diet  The neck pain has not happened this week and sounds musculoskeletal.  Will discuss with MD and have her set up if needed  Discussed with Dr Excell Seltzer  Will set up with Cardiologist for appointment to evaluate pain

## 2011-04-17 NOTE — Telephone Encounter (Signed)
Pt saw pcp due to lower extremity swelling, has been having chest, neck, back and shoulder pain off and on for a month, was told may need a stress test, hasn't seen mclean yet, LDL result from June was 181

## 2011-04-18 NOTE — Telephone Encounter (Signed)
Andrea Mckay in scheduling will call patient to set up an appointment with Dr. Shirlee Latch to evaluate pain.

## 2011-04-23 ENCOUNTER — Encounter: Payer: Self-pay | Admitting: Cardiology

## 2011-04-28 ENCOUNTER — Ambulatory Visit (INDEPENDENT_AMBULATORY_CARE_PROVIDER_SITE_OTHER): Payer: Medicare Other | Admitting: Cardiology

## 2011-04-28 ENCOUNTER — Encounter: Payer: Self-pay | Admitting: Cardiology

## 2011-04-28 DIAGNOSIS — R079 Chest pain, unspecified: Secondary | ICD-10-CM | POA: Insufficient documentation

## 2011-04-28 DIAGNOSIS — E785 Hyperlipidemia, unspecified: Secondary | ICD-10-CM | POA: Insufficient documentation

## 2011-04-28 DIAGNOSIS — E7800 Pure hypercholesterolemia, unspecified: Secondary | ICD-10-CM

## 2011-04-28 NOTE — Assessment & Plan Note (Signed)
LDL is very high but HDL is excellent.  We talked about starting a statin but she strongly wants to avoid pharmacologic treatment.  She does have room to go with her diet.  We discussed cutting out eggs, bacon, and other high fat foods that she eats frequently.  We will repeat lipids in 3 months.

## 2011-04-28 NOTE — Progress Notes (Signed)
PCP: Dr. Laury Axon  65 yo with history of hyperlipidemia and hypothyroidism presents for cardiology followup.  She was seen by Dr. Juanda Chance in the past and had a normal stress myoview in 2010. She is seen today because of chest pain about 3 weeks ago.  She had gone to an outdoor music festival in [place] for several days. After getting home, she felt like she had the flu.  She had a sore throat, abdominal pain, and nausea.  She had pain across her chest and radiating to both shoulders associated with this.  Symptoms lasted a couple of days and resolved.  She has not had any chest pain since that time and was not having chest pain prior.   In general, she is quite active.  She works out at [employer] and does a fair amount of cardiovascular exercise.  No exertional chest pain or dyspnea.  She does report some weight gain over the last year and her diet is not the best.  She eats eggs every day and bacon frequently.  LDL was 189 when last checked.   ECG: NSR, normal  Labs (6/12): K 4.8, creatinine 0.6, HDL 71, LDL 189, thyroid indices normal  PMH: 1. Myoview 2010: No ischemia/infarction, normal study.  2. Hyperlipidemia: Has not wanted to take statins.   3. Hypothyroidism 4. Mitral valve prolapse: Echo (1/10) with EF 65%, mild MVP/mild MR.  5. S/p oophorectomy  SH: Divorced, 3 children live in [place].  Lives in [place].  Nonsmoker.    FH: Father died at 53.  Mother has mitral regurgitation, atrial fibrillation.   Current Outpatient Prescriptions  Medication Sig Dispense Refill  . conjugated estrogens (PREMARIN) vaginal cream Place 0.5 g vaginally 3 (three) times a week.        Marland Kitchen EPINEPHrine (EPIPEN) 0.3 mg/0.3 mL DEVI Inject 0.3 mg into the muscle once.        . nitroGLYCERIN (NITROSTAT) 0.4 MG SL tablet Place 0.4 mg under the tongue every 5 (five) minutes as needed.        . NON FORMULARY Eucerin PRN        . ondansetron (ZOFRAN) 4 MG tablet Take 4 mg by mouth as directed.        .  thyroid (NATURE-THROID) 130 MG tablet Take 130 mg by mouth daily.         Current Facility-Administered Medications  Medication Dose Route Frequency Provider Last Rate Last Dose  . 0.9 %  sodium chloride infusion  500 mL Intravenous Continuous Rob Bunting, MD        BP 121/74  Pulse 77  Resp 16  Ht 5\' 1"  (1.549 m)  Wt 184 lb (83.462 kg)  BMI 34.77 kg/m2 General: NAD Neck: No JVD, no thyromegaly or thyroid nodule.  Lungs: Clear to auscultation bilaterally with normal respiratory effort. CV: Nondisplaced PMI.  Heart regular S1/S2, no S3/S4, no murmur.  No peripheral edema.  No carotid bruit.  Normal pedal pulses.  Abdomen: Soft, nontender, no hepatosplenomegaly, no distention.  Neurologic: Alert and oriented x 3.  Psych: Normal affect. Extremities: No clubbing or cyanosis.

## 2011-04-28 NOTE — Patient Instructions (Signed)
Your physician recommends that you return for a FASTING lipid profile in 3 months--272.0  786.50  Your physician recommends that you schedule a follow-up appointment as needed with Dr Shirlee Latch.

## 2011-04-28 NOTE — Assessment & Plan Note (Signed)
Patient had an episode of chest pain 3 weeks ago in the setting of a flu-like illness after returning from a music festival.  She has had no chest pain since then.  She has not had exertional symptoms.  I suspect that her symptoms were noncardiac.  If she has recurrent chest pain, could consider a stress test but would hold off for now.

## 2011-06-24 NOTE — Telephone Encounter (Signed)
done

## 2011-07-03 ENCOUNTER — Telehealth: Payer: Self-pay | Admitting: Cardiology

## 2011-07-03 ENCOUNTER — Other Ambulatory Visit (INDEPENDENT_AMBULATORY_CARE_PROVIDER_SITE_OTHER): Payer: Medicare Other | Admitting: *Deleted

## 2011-07-03 ENCOUNTER — Other Ambulatory Visit: Payer: Self-pay | Admitting: Cardiology

## 2011-07-03 DIAGNOSIS — R079 Chest pain, unspecified: Secondary | ICD-10-CM

## 2011-07-03 DIAGNOSIS — E7800 Pure hypercholesterolemia, unspecified: Secondary | ICD-10-CM

## 2011-07-03 LAB — LIPID PANEL
Cholesterol: 216 mg/dL — ABNORMAL HIGH (ref 0–200)
HDL: 61.1 mg/dL (ref >39.00)
Total CHOL/HDL Ratio: 4
Triglycerides: 97 mg/dL (ref 0.0–149.0)
VLDL: 19.4 mg/dL (ref 0.0–40.0)

## 2011-07-03 NOTE — Telephone Encounter (Signed)
Pt calling re her medical records stating she is a smoker and she is not and wants it corrected and to talk to [facility]

## 2011-07-03 NOTE — Telephone Encounter (Signed)
I talked with pt. Pt had called billing and was told by billing that somewhere on the bill there was information about pt being a smoker. I told pt our records reflect pt never having smoked and Dr Alford Highland last note 04/28/11 states pt is a  non smoker.

## 2011-07-08 ENCOUNTER — Telehealth: Payer: Self-pay | Admitting: Cardiology

## 2011-07-08 NOTE — Telephone Encounter (Signed)
LMTCB

## 2011-07-08 NOTE — Telephone Encounter (Signed)
F/U:   Returned your call. Believed that it was about her cholesterol. Please feel free to leave a message on her cell phone or mail her her test results if you can not reach her.

## 2011-07-11 NOTE — Telephone Encounter (Signed)
LMTCB

## 2011-07-11 NOTE — Telephone Encounter (Signed)
LM for pt with lab results and Dr Alford Highland recommendations. LM for pt to call me back to discuss further.

## 2011-07-11 NOTE — Telephone Encounter (Signed)
Pt rtn call, said she asked at last call to have anne leave the message on her voicemail, or she can mail her the results

## 2011-07-15 NOTE — Telephone Encounter (Signed)
I left a message at [phone] that I was mailing a copy of pt's recent  lab results to her. These lab results include Dr Alford Highland recommendations for treatment. I asked pt to call me back to let me know if she wanted to follow Dr Alford Highland recommendations for treatment and I would send a prescription into her pharmacy for her.

## 2011-07-21 ENCOUNTER — Other Ambulatory Visit: Payer: Medicare Other | Admitting: *Deleted

## 2011-08-23 DIAGNOSIS — Z23 Encounter for immunization: Secondary | ICD-10-CM | POA: Diagnosis not present

## 2011-09-11 DIAGNOSIS — M25569 Pain in unspecified knee: Secondary | ICD-10-CM | POA: Diagnosis not present

## 2011-09-11 DIAGNOSIS — M7120 Synovial cyst of popliteal space [Baker], unspecified knee: Secondary | ICD-10-CM | POA: Diagnosis not present

## 2011-09-11 DIAGNOSIS — M25469 Effusion, unspecified knee: Secondary | ICD-10-CM | POA: Diagnosis not present

## 2011-09-11 DIAGNOSIS — M1710 Unilateral primary osteoarthritis, unspecified knee: Secondary | ICD-10-CM | POA: Diagnosis not present

## 2011-09-17 DIAGNOSIS — R262 Difficulty in walking, not elsewhere classified: Secondary | ICD-10-CM | POA: Diagnosis not present

## 2011-09-25 DIAGNOSIS — Z0189 Encounter for other specified special examinations: Secondary | ICD-10-CM | POA: Diagnosis not present

## 2011-09-25 DIAGNOSIS — M25559 Pain in unspecified hip: Secondary | ICD-10-CM | POA: Diagnosis not present

## 2011-09-29 DIAGNOSIS — IMO0002 Reserved for concepts with insufficient information to code with codable children: Secondary | ICD-10-CM | POA: Diagnosis not present

## 2011-10-01 DIAGNOSIS — IMO0002 Reserved for concepts with insufficient information to code with codable children: Secondary | ICD-10-CM | POA: Diagnosis not present

## 2011-10-03 DIAGNOSIS — IMO0002 Reserved for concepts with insufficient information to code with codable children: Secondary | ICD-10-CM | POA: Diagnosis not present

## 2011-10-06 DIAGNOSIS — IMO0002 Reserved for concepts with insufficient information to code with codable children: Secondary | ICD-10-CM | POA: Diagnosis not present

## 2011-10-08 DIAGNOSIS — IMO0002 Reserved for concepts with insufficient information to code with codable children: Secondary | ICD-10-CM | POA: Diagnosis not present

## 2011-10-09 DIAGNOSIS — M25579 Pain in unspecified ankle and joints of unspecified foot: Secondary | ICD-10-CM | POA: Diagnosis not present

## 2011-10-13 DIAGNOSIS — IMO0002 Reserved for concepts with insufficient information to code with codable children: Secondary | ICD-10-CM | POA: Diagnosis not present

## 2011-10-15 DIAGNOSIS — IMO0002 Reserved for concepts with insufficient information to code with codable children: Secondary | ICD-10-CM | POA: Diagnosis not present

## 2011-10-16 DIAGNOSIS — M76829 Posterior tibial tendinitis, unspecified leg: Secondary | ICD-10-CM | POA: Diagnosis not present

## 2011-10-16 DIAGNOSIS — M25579 Pain in unspecified ankle and joints of unspecified foot: Secondary | ICD-10-CM | POA: Diagnosis not present

## 2011-10-24 DIAGNOSIS — D485 Neoplasm of uncertain behavior of skin: Secondary | ICD-10-CM | POA: Diagnosis not present

## 2011-10-24 DIAGNOSIS — I781 Nevus, non-neoplastic: Secondary | ICD-10-CM | POA: Diagnosis not present

## 2011-10-24 DIAGNOSIS — L570 Actinic keratosis: Secondary | ICD-10-CM | POA: Diagnosis not present

## 2011-10-24 DIAGNOSIS — D235 Other benign neoplasm of skin of trunk: Secondary | ICD-10-CM | POA: Diagnosis not present

## 2011-11-06 DIAGNOSIS — IMO0002 Reserved for concepts with insufficient information to code with codable children: Secondary | ICD-10-CM | POA: Diagnosis not present

## 2012-01-08 DIAGNOSIS — L570 Actinic keratosis: Secondary | ICD-10-CM | POA: Diagnosis not present

## 2012-01-14 DIAGNOSIS — D2360 Other benign neoplasm of skin of unspecified upper limb, including shoulder: Secondary | ICD-10-CM | POA: Diagnosis not present

## 2012-03-15 DIAGNOSIS — K900 Celiac disease: Secondary | ICD-10-CM | POA: Diagnosis not present

## 2012-03-15 DIAGNOSIS — E039 Hypothyroidism, unspecified: Secondary | ICD-10-CM | POA: Diagnosis not present

## 2012-03-15 DIAGNOSIS — R609 Edema, unspecified: Secondary | ICD-10-CM | POA: Diagnosis not present

## 2012-03-15 DIAGNOSIS — IMO0001 Reserved for inherently not codable concepts without codable children: Secondary | ICD-10-CM | POA: Diagnosis not present

## 2012-03-19 DIAGNOSIS — N9489 Other specified conditions associated with female genital organs and menstrual cycle: Secondary | ICD-10-CM | POA: Diagnosis not present

## 2012-03-19 DIAGNOSIS — Z780 Asymptomatic menopausal state: Secondary | ICD-10-CM | POA: Diagnosis not present

## 2012-06-14 DIAGNOSIS — Z01419 Encounter for gynecological examination (general) (routine) without abnormal findings: Secondary | ICD-10-CM | POA: Diagnosis not present

## 2012-06-14 DIAGNOSIS — Z23 Encounter for immunization: Secondary | ICD-10-CM | POA: Diagnosis not present

## 2012-06-21 DIAGNOSIS — L570 Actinic keratosis: Secondary | ICD-10-CM | POA: Diagnosis not present

## 2012-07-23 DIAGNOSIS — Z85820 Personal history of malignant melanoma of skin: Secondary | ICD-10-CM | POA: Diagnosis not present

## 2012-07-23 DIAGNOSIS — L821 Other seborrheic keratosis: Secondary | ICD-10-CM | POA: Diagnosis not present

## 2012-07-23 DIAGNOSIS — Z8589 Personal history of malignant neoplasm of other organs and systems: Secondary | ICD-10-CM | POA: Diagnosis not present

## 2012-07-23 DIAGNOSIS — L819 Disorder of pigmentation, unspecified: Secondary | ICD-10-CM | POA: Diagnosis not present

## 2012-08-23 DIAGNOSIS — R0789 Other chest pain: Secondary | ICD-10-CM | POA: Diagnosis not present

## 2012-08-23 DIAGNOSIS — K900 Celiac disease: Secondary | ICD-10-CM | POA: Diagnosis not present

## 2012-08-23 DIAGNOSIS — R609 Edema, unspecified: Secondary | ICD-10-CM | POA: Diagnosis not present

## 2012-08-23 DIAGNOSIS — Z1159 Encounter for screening for other viral diseases: Secondary | ICD-10-CM | POA: Diagnosis not present

## 2012-08-23 DIAGNOSIS — IMO0001 Reserved for inherently not codable concepts without codable children: Secondary | ICD-10-CM | POA: Diagnosis not present

## 2012-08-23 DIAGNOSIS — E039 Hypothyroidism, unspecified: Secondary | ICD-10-CM | POA: Diagnosis not present

## 2012-08-23 DIAGNOSIS — Z136 Encounter for screening for cardiovascular disorders: Secondary | ICD-10-CM | POA: Diagnosis not present

## 2012-08-27 DIAGNOSIS — E785 Hyperlipidemia, unspecified: Secondary | ICD-10-CM | POA: Diagnosis not present

## 2012-08-27 DIAGNOSIS — Z1331 Encounter for screening for depression: Secondary | ICD-10-CM | POA: Diagnosis not present

## 2012-08-27 DIAGNOSIS — R0789 Other chest pain: Secondary | ICD-10-CM | POA: Diagnosis not present

## 2012-08-27 DIAGNOSIS — Z9181 History of falling: Secondary | ICD-10-CM | POA: Diagnosis not present

## 2012-08-27 DIAGNOSIS — R7301 Impaired fasting glucose: Secondary | ICD-10-CM | POA: Diagnosis not present

## 2012-08-27 DIAGNOSIS — E039 Hypothyroidism, unspecified: Secondary | ICD-10-CM | POA: Diagnosis not present

## 2012-09-24 DIAGNOSIS — Z1231 Encounter for screening mammogram for malignant neoplasm of breast: Secondary | ICD-10-CM | POA: Diagnosis not present

## 2012-09-24 DIAGNOSIS — N959 Unspecified menopausal and perimenopausal disorder: Secondary | ICD-10-CM | POA: Diagnosis not present

## 2012-11-05 DIAGNOSIS — R109 Unspecified abdominal pain: Secondary | ICD-10-CM | POA: Diagnosis not present

## 2012-12-20 ENCOUNTER — Telehealth: Payer: Self-pay | Admitting: Gastroenterology

## 2012-12-20 DIAGNOSIS — R918 Other nonspecific abnormal finding of lung field: Secondary | ICD-10-CM | POA: Diagnosis not present

## 2012-12-20 DIAGNOSIS — R05 Cough: Secondary | ICD-10-CM | POA: Diagnosis not present

## 2012-12-20 DIAGNOSIS — M546 Pain in thoracic spine: Secondary | ICD-10-CM | POA: Diagnosis not present

## 2012-12-20 NOTE — Telephone Encounter (Signed)
The appendiceal orifice was seen. This is the opening to the appendix and can be seen even after the appendix is removed.  I reviewed pictures again and the appendiceal orifice picture is clear.

## 2012-12-20 NOTE — Telephone Encounter (Signed)
I explained to the patient and all questions were answered.  She will call back for any additional questions or concerns.  She thanked me for the call

## 2012-12-20 NOTE — Telephone Encounter (Signed)
Patient was reading her colonoscopy report from 02/07/2011.  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  Dr. Christella Hartigan, please review your procedure note and advise if you were able to visualize her appendix.  As stated in the report

## 2012-12-27 DIAGNOSIS — R300 Dysuria: Secondary | ICD-10-CM | POA: Diagnosis not present

## 2012-12-27 DIAGNOSIS — N949 Unspecified condition associated with female genital organs and menstrual cycle: Secondary | ICD-10-CM | POA: Diagnosis not present

## 2013-01-03 DIAGNOSIS — M4120 Other idiopathic scoliosis, site unspecified: Secondary | ICD-10-CM | POA: Diagnosis not present

## 2013-01-03 DIAGNOSIS — N949 Unspecified condition associated with female genital organs and menstrual cycle: Secondary | ICD-10-CM | POA: Diagnosis not present

## 2013-01-03 DIAGNOSIS — IMO0002 Reserved for concepts with insufficient information to code with codable children: Secondary | ICD-10-CM | POA: Diagnosis not present

## 2013-01-17 DIAGNOSIS — R748 Abnormal levels of other serum enzymes: Secondary | ICD-10-CM | POA: Diagnosis not present

## 2013-01-17 DIAGNOSIS — R7301 Impaired fasting glucose: Secondary | ICD-10-CM | POA: Diagnosis not present

## 2013-01-17 DIAGNOSIS — R05 Cough: Secondary | ICD-10-CM | POA: Diagnosis not present

## 2013-01-17 DIAGNOSIS — R918 Other nonspecific abnormal finding of lung field: Secondary | ICD-10-CM | POA: Diagnosis not present

## 2013-01-28 DIAGNOSIS — M546 Pain in thoracic spine: Secondary | ICD-10-CM | POA: Diagnosis not present

## 2013-01-31 DIAGNOSIS — M546 Pain in thoracic spine: Secondary | ICD-10-CM | POA: Diagnosis not present

## 2013-02-07 DIAGNOSIS — M546 Pain in thoracic spine: Secondary | ICD-10-CM | POA: Diagnosis not present

## 2013-02-11 DIAGNOSIS — M546 Pain in thoracic spine: Secondary | ICD-10-CM | POA: Diagnosis not present

## 2013-02-14 DIAGNOSIS — M546 Pain in thoracic spine: Secondary | ICD-10-CM | POA: Diagnosis not present

## 2013-03-26 DIAGNOSIS — R21 Rash and other nonspecific skin eruption: Secondary | ICD-10-CM | POA: Diagnosis not present

## 2013-03-26 DIAGNOSIS — R234 Changes in skin texture: Secondary | ICD-10-CM | POA: Diagnosis not present

## 2013-03-26 DIAGNOSIS — B029 Zoster without complications: Secondary | ICD-10-CM | POA: Diagnosis not present

## 2013-05-04 DIAGNOSIS — H40019 Open angle with borderline findings, low risk, unspecified eye: Secondary | ICD-10-CM | POA: Diagnosis not present

## 2013-05-04 DIAGNOSIS — H5210 Myopia, unspecified eye: Secondary | ICD-10-CM | POA: Diagnosis not present

## 2013-05-04 DIAGNOSIS — H179 Unspecified corneal scar and opacity: Secondary | ICD-10-CM | POA: Diagnosis not present

## 2013-05-25 DIAGNOSIS — Z23 Encounter for immunization: Secondary | ICD-10-CM | POA: Diagnosis not present

## 2013-06-20 DIAGNOSIS — L708 Other acne: Secondary | ICD-10-CM | POA: Diagnosis not present

## 2013-06-20 DIAGNOSIS — L570 Actinic keratosis: Secondary | ICD-10-CM | POA: Diagnosis not present

## 2013-06-20 DIAGNOSIS — D239 Other benign neoplasm of skin, unspecified: Secondary | ICD-10-CM | POA: Diagnosis not present

## 2013-10-19 DIAGNOSIS — E039 Hypothyroidism, unspecified: Secondary | ICD-10-CM | POA: Diagnosis not present

## 2013-10-19 DIAGNOSIS — IMO0001 Reserved for inherently not codable concepts without codable children: Secondary | ICD-10-CM | POA: Diagnosis not present

## 2013-10-19 DIAGNOSIS — R7301 Impaired fasting glucose: Secondary | ICD-10-CM | POA: Diagnosis not present

## 2013-10-19 DIAGNOSIS — G4700 Insomnia, unspecified: Secondary | ICD-10-CM | POA: Diagnosis not present

## 2013-10-25 DIAGNOSIS — E785 Hyperlipidemia, unspecified: Secondary | ICD-10-CM | POA: Diagnosis not present

## 2013-10-25 DIAGNOSIS — E039 Hypothyroidism, unspecified: Secondary | ICD-10-CM | POA: Diagnosis not present

## 2013-10-25 DIAGNOSIS — R7301 Impaired fasting glucose: Secondary | ICD-10-CM | POA: Diagnosis not present

## 2013-12-26 DIAGNOSIS — R7309 Other abnormal glucose: Secondary | ICD-10-CM | POA: Diagnosis not present

## 2013-12-26 DIAGNOSIS — M25519 Pain in unspecified shoulder: Secondary | ICD-10-CM | POA: Diagnosis not present

## 2014-03-01 DIAGNOSIS — L538 Other specified erythematous conditions: Secondary | ICD-10-CM | POA: Diagnosis not present

## 2014-03-01 DIAGNOSIS — D1801 Hemangioma of skin and subcutaneous tissue: Secondary | ICD-10-CM | POA: Diagnosis not present

## 2014-03-01 DIAGNOSIS — L570 Actinic keratosis: Secondary | ICD-10-CM | POA: Diagnosis not present

## 2014-03-01 DIAGNOSIS — R58 Hemorrhage, not elsewhere classified: Secondary | ICD-10-CM | POA: Diagnosis not present

## 2014-03-01 DIAGNOSIS — L820 Inflamed seborrheic keratosis: Secondary | ICD-10-CM | POA: Diagnosis not present

## 2014-05-04 DIAGNOSIS — H40019 Open angle with borderline findings, low risk, unspecified eye: Secondary | ICD-10-CM | POA: Diagnosis not present

## 2014-05-04 DIAGNOSIS — H5210 Myopia, unspecified eye: Secondary | ICD-10-CM | POA: Diagnosis not present

## 2014-05-15 DIAGNOSIS — M25519 Pain in unspecified shoulder: Secondary | ICD-10-CM | POA: Diagnosis not present

## 2014-05-15 DIAGNOSIS — M67919 Unspecified disorder of synovium and tendon, unspecified shoulder: Secondary | ICD-10-CM | POA: Diagnosis not present

## 2014-05-15 DIAGNOSIS — M719 Bursopathy, unspecified: Secondary | ICD-10-CM | POA: Diagnosis not present

## 2014-05-19 ENCOUNTER — Other Ambulatory Visit: Payer: Self-pay | Admitting: Dermatology

## 2014-05-19 DIAGNOSIS — D2262 Melanocytic nevi of left upper limb, including shoulder: Secondary | ICD-10-CM | POA: Diagnosis not present

## 2014-05-19 DIAGNOSIS — L821 Other seborrheic keratosis: Secondary | ICD-10-CM | POA: Diagnosis not present

## 2014-05-19 DIAGNOSIS — L111 Transient acantholytic dermatosis [Grover]: Secondary | ICD-10-CM | POA: Diagnosis not present

## 2014-05-19 DIAGNOSIS — D2261 Melanocytic nevi of right upper limb, including shoulder: Secondary | ICD-10-CM | POA: Diagnosis not present

## 2014-05-19 DIAGNOSIS — L814 Other melanin hyperpigmentation: Secondary | ICD-10-CM | POA: Diagnosis not present

## 2014-05-19 DIAGNOSIS — L119 Acantholytic disorder, unspecified: Secondary | ICD-10-CM | POA: Diagnosis not present

## 2014-05-19 DIAGNOSIS — D1801 Hemangioma of skin and subcutaneous tissue: Secondary | ICD-10-CM | POA: Diagnosis not present

## 2014-05-19 DIAGNOSIS — L570 Actinic keratosis: Secondary | ICD-10-CM | POA: Diagnosis not present

## 2014-05-19 DIAGNOSIS — L309 Dermatitis, unspecified: Secondary | ICD-10-CM | POA: Diagnosis not present

## 2014-05-22 DIAGNOSIS — R7302 Impaired glucose tolerance (oral): Secondary | ICD-10-CM | POA: Diagnosis not present

## 2014-05-22 DIAGNOSIS — E780 Pure hypercholesterolemia: Secondary | ICD-10-CM | POA: Diagnosis not present

## 2014-05-22 DIAGNOSIS — E039 Hypothyroidism, unspecified: Secondary | ICD-10-CM | POA: Diagnosis not present

## 2014-05-23 DIAGNOSIS — R454 Irritability and anger: Secondary | ICD-10-CM | POA: Diagnosis not present

## 2014-05-23 DIAGNOSIS — F341 Dysthymic disorder: Secondary | ICD-10-CM | POA: Diagnosis not present

## 2014-05-30 DIAGNOSIS — M19012 Primary osteoarthritis, left shoulder: Secondary | ICD-10-CM | POA: Diagnosis not present

## 2014-06-01 DIAGNOSIS — F341 Dysthymic disorder: Secondary | ICD-10-CM | POA: Diagnosis not present

## 2014-06-01 DIAGNOSIS — R454 Irritability and anger: Secondary | ICD-10-CM | POA: Diagnosis not present

## 2014-06-07 ENCOUNTER — Encounter: Payer: Self-pay | Admitting: Internal Medicine

## 2014-06-07 ENCOUNTER — Ambulatory Visit (INDEPENDENT_AMBULATORY_CARE_PROVIDER_SITE_OTHER): Payer: Medicare Other | Admitting: Internal Medicine

## 2014-06-07 VITALS — BP 118/80 | HR 70 | Temp 98.1°F | Resp 12 | Wt 191.5 lb

## 2014-06-07 DIAGNOSIS — M7581 Other shoulder lesions, right shoulder: Secondary | ICD-10-CM | POA: Diagnosis not present

## 2014-06-07 DIAGNOSIS — L111 Transient acantholytic dermatosis [Grover]: Secondary | ICD-10-CM | POA: Diagnosis not present

## 2014-06-07 DIAGNOSIS — G479 Sleep disorder, unspecified: Secondary | ICD-10-CM

## 2014-06-07 DIAGNOSIS — L299 Pruritus, unspecified: Secondary | ICD-10-CM | POA: Diagnosis not present

## 2014-06-07 DIAGNOSIS — M25512 Pain in left shoulder: Secondary | ICD-10-CM | POA: Diagnosis not present

## 2014-06-07 MED ORDER — RANITIDINE HCL 150 MG PO TABS
150.0000 mg | ORAL_TABLET | Freq: Two times a day (BID) | ORAL | Status: DC
Start: 2014-06-07 — End: 2014-11-09

## 2014-06-07 NOTE — Progress Notes (Signed)
Pre visit review using our clinic review tool, if applicable. No additional management support is needed unless otherwise documented below in the visit note. 

## 2014-06-07 NOTE — Patient Instructions (Addendum)
[facility]  referral will be scheduled and you'll be notified of the time.Please call the Referral Co-Ordinator @ [phone] if you have not been notified of appointment time within 7-10 days.  Use  Aveeno Daily  Moisturizing Lotion  twice a day  for the dry skin. Bathe with moisturizing liquid soap , not bar soap.

## 2014-06-07 NOTE — Progress Notes (Signed)
   Subjective:    Patient ID: Andrea Mckay, female    DOB: [birth year], 68 y.o.   MRN: [id number]  HPI   She is here with multiple concerns. She has had a diffuse rash since 4/15. Initially this involved her scalp. It has also  been on the thighs but that did resolve. At this time it remains active on the neck and left lower quadrant.  An area of the back was biopsied 05/19/14 by Dr. Ubaldo Glassing. This revealed Grover's disease.  Topical high-dose steroids provided temporary benefit only.  She continues to use Benadryl and Zyrtec at bedtime.  She's concerned as she did have elevated liver enzymes in 2011. Apparently her endocrinologist had noted the temporary elevations in liver function tests. She took herbal preparations with resolution of the hepatic enzyme rise  Last hepatic function test on record here were 2012. At that time AST was 22 and ALT 27.   She is having trouble staying asleep and she is restless and will be awake for 2-3 hours. She has seen  [facility]. A sleep study was not recommended.  She has a remote history of lead paint exposure. 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      Review of Systems   No associated itchy, watery eyes.  Swelling of the lips or tongue denied.  Shortness of breath, wheezing, or cough absent.  Fever ,chills , or sweats denied. Purulence absent.  Diarrhea not present.      Objective:   Physical Exam   Pertinent or positive findings include: She has a diffuse erythema and drying of the posterior neck @ & below the scalp line. She has diffuse faint erythematous  clustered papules of the left lower quadrant which blanch with pressure Abdomen slightly protuberant.  General appearance:good health ;well nourished; no acute distress or increased work of breathing is present.  No  lymphadenopathy about the head, neck, or axilla noted.  Eyes: No conjunctival inflammation or  lid edema is present. There is no scleral icterus. Ears:  External ear exam shows no significant lesions or deformities.  Otoscopic examination reveals clear canals, tympanic membranes are intact bilaterally without bulging, retraction, inflammation or discharge. Nose:  External nasal examination shows no deformity or inflammation. Nasal mucosa are pink and moist without lesions or exudates. No septal dislocation or deviation.No obstruction to airflow.  Oral exam: Dental hygiene is good; lips and gums are healthy appearing.There is no oropharyngeal erythema or exudate noted.  Neck:  No deformities, thyromegaly, masses, or tenderness noted.   Supple with full range of motion without pain.  Heart:  Normal rate and regular rhythm. S1 and S2 normal without gallop, murmur, click, rub or other extra sounds. Lungs:Chest clear to auscultation; no wheezes, rhonchi,rales ,or rubs present.No increased work of breathing.   Extremities:  No cyanosis, edema, or clubbing  noted  Skin: Warm & dry w/o jaundice or tenting.         Assessment & Plan:  #1 Grovers disease #2 sleep disorder #3 PMH elevated LFTs $3 PMH lead paint exposure See Orders & AVS

## 2014-06-08 DIAGNOSIS — M25512 Pain in left shoulder: Secondary | ICD-10-CM | POA: Diagnosis not present

## 2014-06-08 DIAGNOSIS — L239 Allergic contact dermatitis, unspecified cause: Secondary | ICD-10-CM | POA: Diagnosis not present

## 2014-06-08 DIAGNOSIS — M6281 Muscle weakness (generalized): Secondary | ICD-10-CM | POA: Diagnosis not present

## 2014-06-08 DIAGNOSIS — M7502 Adhesive capsulitis of left shoulder: Secondary | ICD-10-CM | POA: Diagnosis not present

## 2014-06-08 DIAGNOSIS — M25612 Stiffness of left shoulder, not elsewhere classified: Secondary | ICD-10-CM | POA: Diagnosis not present

## 2014-06-08 DIAGNOSIS — L2089 Other atopic dermatitis: Secondary | ICD-10-CM | POA: Diagnosis not present

## 2014-06-12 DIAGNOSIS — M25612 Stiffness of left shoulder, not elsewhere classified: Secondary | ICD-10-CM | POA: Diagnosis not present

## 2014-06-12 DIAGNOSIS — M6281 Muscle weakness (generalized): Secondary | ICD-10-CM | POA: Diagnosis not present

## 2014-06-12 DIAGNOSIS — L2089 Other atopic dermatitis: Secondary | ICD-10-CM | POA: Diagnosis not present

## 2014-06-12 DIAGNOSIS — M25512 Pain in left shoulder: Secondary | ICD-10-CM | POA: Diagnosis not present

## 2014-06-12 DIAGNOSIS — M7502 Adhesive capsulitis of left shoulder: Secondary | ICD-10-CM | POA: Diagnosis not present

## 2014-06-13 ENCOUNTER — Encounter: Payer: Self-pay | Admitting: Neurology

## 2014-06-13 ENCOUNTER — Ambulatory Visit (INDEPENDENT_AMBULATORY_CARE_PROVIDER_SITE_OTHER): Payer: Medicare Other | Admitting: Neurology

## 2014-06-13 VITALS — BP 134/72 | HR 69 | Temp 98.5°F | Ht 67.0 in | Wt 195.0 lb

## 2014-06-13 DIAGNOSIS — G4733 Obstructive sleep apnea (adult) (pediatric): Secondary | ICD-10-CM

## 2014-06-13 DIAGNOSIS — G4761 Periodic limb movement disorder: Secondary | ICD-10-CM

## 2014-06-13 DIAGNOSIS — G2581 Restless legs syndrome: Secondary | ICD-10-CM

## 2014-06-13 DIAGNOSIS — R351 Nocturia: Secondary | ICD-10-CM | POA: Diagnosis not present

## 2014-06-13 DIAGNOSIS — I341 Nonrheumatic mitral (valve) prolapse: Secondary | ICD-10-CM

## 2014-06-13 NOTE — Patient Instructions (Signed)

## 2014-06-13 NOTE — Progress Notes (Signed)
Subjective:    Patient ID: Andrea Mckay is a 68 y.o. female.  HPI    Star Age, MD, PhD [facility] [address], [address] [place], [place] [address]  Dear Dr. Linna Darner,   I saw your patient, Andrea Mckay, upon your kind request in my neurologic clinic today for initial consultation for sleep disturbance, in particular, concern for underlying obstructive sleep apnea. The patient is unaccompanied today. As you know, Andrea Mckay is a 68 year old right-handed woman with an underlying medical history of hyperlipidemia, hypothyroidism, mitral valve prolapse, fibromyalgia (saw Dr. Estanislado Pandy) and obesity, who reports difficulty with sleep maintenance, snoring and witnessed apneas, per boyfriend and Andrea Mckay sister.  She moved from [place] a month ago. She has intermittent RLS symptoms and has tried herbal and natural remedies. She sleeps alone. She has been told, that snoring can be loud. She has never had a sleep study. She is very sensitive to medications and may have tried trazodone one time, which gave Andrea Mckay a very bad "hangover". She has woken up from Andrea Mckay own snoring and from Andrea Mckay leg kicking and leg jerking. She denies morning HAs. She has been on H1 and H2 blockers for an itchy rash and has been sleeping better. She may nap sometimes. She has an ESS of 2/24 today, Andrea Mckay fatigue score is 4. She drinks coffee in the morning and green tea before 3 PM.  She has nocturia x 1 per night.  Andrea Mckay mother has OSA and is on CPAP.   Andrea Mckay typical bedtime is reported to be around 8 or 9 PM and usual wake time is around 4 to 5 AM, because she is awake. 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 She feels adequately rested upon awakening. She wakes up on an average 1 times in the middle of the night and has to go to the bathroom 1 times on a typical night. She denies morning headaches. She has not fallen asleep while driving. She has no nighttime  reflux; she has nasal congestion.  She is a restless sleeper and in the morning, the bed is quite disheveled.   She denies cataplexy, sleep paralysis, hypnagogic or hypnopompic hallucinations, or sleep attacks. She does not report any vivid dreams, nightmares, dream enactments, or parasomnias, such as sleep talking or sleep walking. The patient has not had a sleep study or a home sleep test. Andrea Mckay bedroom is usually dark and cool. There is no TV in the bedroom.  She is a non-smoker and drinks alcohol occasionally.   Andrea Mckay Past Medical History Is Significant For: Past Medical History  Diagnosis Date  . Myalgia and myositis, unspecified   . Cancer 01/2006    melanoma  . Thyroid disease     hypothyroid  . Postmenopausal   . Goiter   . Fibromyalgia   . Kidney stone   . Allergy   . Arthritis     osteoarthritis  . Diabetes mellitus     diet controlled  . Hyperlipidemia   . Osteoporosis     osteopenia  . Heart murmur   . Foreign body     foot  . Diarrhea   . Candidiasis     vagina/vulva  . Tick bite   . Malignant melanoma   . S/P colonoscopy   . Coronary artery disease     Andrea Mckay Past Surgical History Is Significant For: Past Surgical History  Procedure Laterality Date  . Thyroidectomy  1982  . Abdominal hysterectomy  1987  . Knee arthroscopy w/ acl reconstruction    . Foot tendon surgery  1982    Left  . Ovarian cyst removal  1999    removed b/l   . Lipoma excision  01/20/2011    Andrea Mckay Family History Is Significant For: Family History  Problem Relation Age of Onset  . Cancer Paternal Grandmother     pancreatic  . Pancreatic cancer Paternal Grandmother   . Diabetes Maternal Aunt   . Prostate cancer    . Atrial fibrillation Mother   . Hypertension Mother   . Colon polyps Mother   . Colon polyps Daughter     Adenomatous  . Cancer Father 63    prostate  . Parkinsonism Paternal Uncle   . Heart disease Maternal Grandmother 75    MI  . Cancer Maternal Aunt 80    colon     Andrea Mckay Social History Is Significant For: History   Social History  . Marital Status: Divorced    Spouse Name: N/A    Number of Children: N/A  . Years of Education: N/A   Occupational History  . accounting P/T    Social History Main Topics  . Smoking status: Never Smoker   . Smokeless tobacco: Never Used  . Alcohol Use: No  . Drug Use: No  . Sexual Activity: No   Other Topics Concern  . None   Social History Narrative  . None    Andrea Mckay Allergies Are:  Allergies  Allergen Reactions  . Cephalosporins   . Ciprofloxacin   . Doxycycline   . Iohexol   . Lasix [Furosemide]   . Moxifloxacin   . Nabumetone   . Nitrofurantoin   . Nsaids Other (See Comments)    Told to avoid because of kidneys  . Ofloxacin   . Oxaprozin   . Quinolones   . Risedronate Sodium   . Soy Allergy   . Spironolactone   . Sulfonamide Derivatives   . Erythromycin Rash  . Iodine Nausea Only, Rash and Other (See Comments)    arrythmia  . Latex Rash and Cough  . Levothyroxine Sodium Palpitations  :   Andrea Mckay Current Medications Are:  Outpatient Encounter Prescriptions as of 06/13/2014  Medication Sig  . aspirin 81 MG tablet Take 81 mg by mouth daily.  Marland Kitchen EPINEPHrine (EPIPEN) 0.3 mg/0.3 mL DEVI Inject 0.3 mg into the muscle once.    . nitroGLYCERIN (NITROSTAT) 0.4 MG SL tablet Place 0.4 mg under the tongue every 5 (five) minutes as needed.    . ranitidine (ZANTAC) 150 MG tablet Take 1 tablet (150 mg total) by mouth 2 (two) times daily.  Marland Kitchen thyroid (NATURE-THROID) 130 MG tablet Take 130 mg by mouth daily.    Marland Kitchen UNABLE TO FIND Med Name: H2 Inhibitor  . NON FORMULARY Eucerin PRN    . ondansetron (ZOFRAN) 4 MG tablet Take 4 mg by mouth as directed.    :  Review of Systems:  Out of a complete 14 point review of systems, all are reviewed and negative with the exception of these symptoms as listed below:   Review of Systems  Constitutional: Positive for fatigue and unexpected weight change.  Eyes:  Positive for visual disturbance.  Cardiovascular:       Snoring  Endocrine: Positive for cold intolerance and heat intolerance.  Skin: Positive for rash.  Allergic/Immunologic: Positive for environmental allergies and food allergies.    Objective:  Neurologic Exam  Physical  Exam Physical Examination:   Filed Vitals:   06/13/14 0900  BP: 134/72  Pulse: 69  Temp: 98.5 F (36.9 C)    General Examination: The patient is a very pleasant 68 y.o. female in no acute distress. She appears well-developed and well-nourished and well groomed.   HEENT: Normocephalic, atraumatic, pupils are equal, round and reactive to light and accommodation. Funduscopic exam is normal with sharp disc margins noted. Extraocular tracking is good without limitation to gaze excursion or nystagmus noted. Normal smooth pursuit is noted. Hearing is grossly intact. Tympanic membranes are clear bilaterally. Face is symmetric with normal facial animation and normal facial sensation. Speech is clear with no dysarthria noted. There is no hypophonia. There is no lip, neck/head, jaw or voice tremor. Neck is supple with full range of passive and active motion. There are no carotid bruits on auscultation. Oropharynx exam reveals: moderate mouth dryness, adequate dental hygiene and moderate airway crowding, due to narrow airway entry and redundant soft palate. Mallampati is class II. Tongue protrudes centrally and palate elevates symmetrically. Tonsils are 1+, R a little bigger than L. Neck size is 15 inches. She has a Mild overbite. Nasal inspection reveals no significant nasal mucosal bogginess or redness and no septal deviation.   Chest: Clear to auscultation without wheezing, rhonchi or crackles noted.  Heart: S1+S2+0, regular and normal without murmurs, rubs or gallops noted.   Abdomen: Soft, non-tender and non-distended with normal bowel sounds appreciated on auscultation.  Extremities: There is trace pitting edema in the  distal lower extremities bilaterally. Pedal pulses are intact.  Skin: Warm and dry without trophic changes noted. There are no varicose veins.  Musculoskeletal: exam reveals no obvious joint deformities, tenderness or joint swelling or erythema.   Neurologically:  Mental status: The patient is awake, alert and oriented in all 4 spheres. Andrea Mckay immediate and remote memory, attention, language skills and fund of knowledge are appropriate. There is no evidence of aphasia, agnosia, apraxia or anomia. Speech is clear with normal prosody and enunciation. Thought process is linear. Mood is normal and affect is normal.  Cranial nerves II - XII are as described above under HEENT exam. In addition: shoulder shrug is normal with equal shoulder height noted. Motor exam: Normal bulk, strength and tone is noted. There is no drift, tremor or rebound. Romberg is negative. Reflexes are 2+ throughout. Babinski: Toes are flexor bilaterally. Fine motor skills and coordination: intact with normal finger taps, normal hand movements, normal rapid alternating patting, normal foot taps and normal foot agility.  Cerebellar testing: No dysmetria or intention tremor on finger to nose testing. Heel to shin is unremarkable bilaterally. There is no truncal or gait ataxia.  Sensory exam: intact to light touch, pinprick, vibration, temperature sense in the upper and lower extremities.  Gait, station and balance: She stands easily. No veering to one side is noted. No leaning to one side is noted. Posture is age-appropriate and stance is narrow based. Gait shows normal stride length and normal pace. No problems turning are noted. She turns en bloc. Tandem walk is slightly difficult for Andrea Mckay. Intact toe and heel stance is noted.               Assessment and Plan:   In summary, LYAN HOLCK is a very pleasant 68 y.o.-year old female with an underlying medical history of hyperlipidemia, hypothyroidism, mitral valve prolapse, fibromyalgia  (saw Dr. Estanislado Pandy) and obesity, who reports difficulty with sleep maintenance, snoring and witnessed apneas. Andrea Mckay history  and physical exam are concerning for obstructive sleep apnea (OSA). In addition, she reports RLS symptoms and PLMs.  I had a long chat with the patient about my findings and the diagnosis of OSA, its prognosis and treatment options. We talked about medical treatments, surgical interventions and non-pharmacological approaches. I explained in particular the risks and ramifications of untreated moderate to severe OSA, especially with respect to developing cardiovascular disease down the Road, including congestive heart failure, difficult to treat hypertension, cardiac arrhythmias, or stroke. Even type 2 diabetes has, in part, been linked to untreated OSA. Symptoms of untreated OSA include daytime sleepiness, memory problems, mood irritability and mood disorder such as depression and anxiety, lack of energy, as well as recurrent headaches, especially morning headaches. We talked about trying to maintain a healthy lifestyle in general, as well as the importance of weight control. I encouraged the patient to eat healthy, exercise daily and keep well hydrated, to keep a scheduled bedtime and wake time routine, to not skip any meals and eat healthy snacks in between meals. I advised the patient not to drive when feeling sleepy. I recommended the following at this time: sleep study with potential positive airway pressure titration. (We will score hypopneas at 4% and split the sleep study into diagnostic and treatment portion, if the estimated. 2 hour AHI is >20/h).   I explained the sleep test procedure to the patient and also outlined possible surgical and non-surgical treatment options of OSA, including the use of a custom-made dental device (which would require a referral to a specialist dentist or oral surgeon), upper airway surgical options, such as pillar implants, radiofrequency surgery, tongue  base surgery, and UPPP (which would involve a referral to an ENT surgeon). Rarely, jaw surgery such as mandibular advancement may be considered.  I also explained the CPAP treatment option to the patient, who indicated that she would be willing to try CPAP if the need arises. I explained the importance of being compliant with PAP treatment, not only for insurance purposes but primarily to improve Andrea Mckay symptoms, and for the patient's long term health benefit, including to reduce Andrea Mckay cardiovascular risks. I answered all Andrea Mckay questions today and the patient was in agreement. I would like to see Andrea Mckay back after the sleep study is completed and encouraged Andrea Mckay to call with any interim questions, concerns, problems or updates.   Thank you very much for allowing me to participate in the care of this nice patient. If I can be of any further assistance to you please do not hesitate to call me at [phone].  Sincerely,   Star Age, MD, PhD

## 2014-06-14 DIAGNOSIS — M7502 Adhesive capsulitis of left shoulder: Secondary | ICD-10-CM | POA: Diagnosis not present

## 2014-06-14 DIAGNOSIS — M25612 Stiffness of left shoulder, not elsewhere classified: Secondary | ICD-10-CM | POA: Diagnosis not present

## 2014-06-14 DIAGNOSIS — M25512 Pain in left shoulder: Secondary | ICD-10-CM | POA: Diagnosis not present

## 2014-06-14 DIAGNOSIS — M6281 Muscle weakness (generalized): Secondary | ICD-10-CM | POA: Diagnosis not present

## 2014-06-14 DIAGNOSIS — L2089 Other atopic dermatitis: Secondary | ICD-10-CM | POA: Diagnosis not present

## 2014-06-16 DIAGNOSIS — L2089 Other atopic dermatitis: Secondary | ICD-10-CM | POA: Diagnosis not present

## 2014-06-19 DIAGNOSIS — M7502 Adhesive capsulitis of left shoulder: Secondary | ICD-10-CM | POA: Diagnosis not present

## 2014-06-19 DIAGNOSIS — L2089 Other atopic dermatitis: Secondary | ICD-10-CM | POA: Diagnosis not present

## 2014-06-19 DIAGNOSIS — M6281 Muscle weakness (generalized): Secondary | ICD-10-CM | POA: Diagnosis not present

## 2014-06-19 DIAGNOSIS — M25612 Stiffness of left shoulder, not elsewhere classified: Secondary | ICD-10-CM | POA: Diagnosis not present

## 2014-06-19 DIAGNOSIS — M25512 Pain in left shoulder: Secondary | ICD-10-CM | POA: Diagnosis not present

## 2014-06-21 DIAGNOSIS — M7502 Adhesive capsulitis of left shoulder: Secondary | ICD-10-CM | POA: Diagnosis not present

## 2014-06-21 DIAGNOSIS — M25612 Stiffness of left shoulder, not elsewhere classified: Secondary | ICD-10-CM | POA: Diagnosis not present

## 2014-06-21 DIAGNOSIS — M25512 Pain in left shoulder: Secondary | ICD-10-CM | POA: Diagnosis not present

## 2014-06-21 DIAGNOSIS — M6281 Muscle weakness (generalized): Secondary | ICD-10-CM | POA: Diagnosis not present

## 2014-06-21 DIAGNOSIS — L2089 Other atopic dermatitis: Secondary | ICD-10-CM | POA: Diagnosis not present

## 2014-06-26 DIAGNOSIS — L2089 Other atopic dermatitis: Secondary | ICD-10-CM | POA: Diagnosis not present

## 2014-06-26 DIAGNOSIS — M7502 Adhesive capsulitis of left shoulder: Secondary | ICD-10-CM | POA: Diagnosis not present

## 2014-06-26 DIAGNOSIS — M25612 Stiffness of left shoulder, not elsewhere classified: Secondary | ICD-10-CM | POA: Diagnosis not present

## 2014-06-26 DIAGNOSIS — M25512 Pain in left shoulder: Secondary | ICD-10-CM | POA: Diagnosis not present

## 2014-06-26 DIAGNOSIS — M6281 Muscle weakness (generalized): Secondary | ICD-10-CM | POA: Diagnosis not present

## 2014-06-28 DIAGNOSIS — M6281 Muscle weakness (generalized): Secondary | ICD-10-CM | POA: Diagnosis not present

## 2014-06-28 DIAGNOSIS — L2089 Other atopic dermatitis: Secondary | ICD-10-CM | POA: Diagnosis not present

## 2014-06-28 DIAGNOSIS — M25512 Pain in left shoulder: Secondary | ICD-10-CM | POA: Diagnosis not present

## 2014-06-28 DIAGNOSIS — M25612 Stiffness of left shoulder, not elsewhere classified: Secondary | ICD-10-CM | POA: Diagnosis not present

## 2014-06-28 DIAGNOSIS — M25511 Pain in right shoulder: Secondary | ICD-10-CM | POA: Diagnosis not present

## 2014-06-30 DIAGNOSIS — L2089 Other atopic dermatitis: Secondary | ICD-10-CM | POA: Diagnosis not present

## 2014-07-03 DIAGNOSIS — M25612 Stiffness of left shoulder, not elsewhere classified: Secondary | ICD-10-CM | POA: Diagnosis not present

## 2014-07-03 DIAGNOSIS — M7502 Adhesive capsulitis of left shoulder: Secondary | ICD-10-CM | POA: Diagnosis not present

## 2014-07-03 DIAGNOSIS — M25512 Pain in left shoulder: Secondary | ICD-10-CM | POA: Diagnosis not present

## 2014-07-03 DIAGNOSIS — L2089 Other atopic dermatitis: Secondary | ICD-10-CM | POA: Diagnosis not present

## 2014-07-03 DIAGNOSIS — M6281 Muscle weakness (generalized): Secondary | ICD-10-CM | POA: Diagnosis not present

## 2014-07-05 DIAGNOSIS — M25512 Pain in left shoulder: Secondary | ICD-10-CM | POA: Diagnosis not present

## 2014-07-05 DIAGNOSIS — L2089 Other atopic dermatitis: Secondary | ICD-10-CM | POA: Diagnosis not present

## 2014-07-05 DIAGNOSIS — M7581 Other shoulder lesions, right shoulder: Secondary | ICD-10-CM | POA: Diagnosis not present

## 2014-07-06 DIAGNOSIS — L111 Transient acantholytic dermatosis [Grover]: Secondary | ICD-10-CM | POA: Diagnosis not present

## 2014-07-18 DIAGNOSIS — L2089 Other atopic dermatitis: Secondary | ICD-10-CM | POA: Diagnosis not present

## 2014-07-19 DIAGNOSIS — M25612 Stiffness of left shoulder, not elsewhere classified: Secondary | ICD-10-CM | POA: Diagnosis not present

## 2014-07-19 DIAGNOSIS — M7502 Adhesive capsulitis of left shoulder: Secondary | ICD-10-CM | POA: Diagnosis not present

## 2014-07-19 DIAGNOSIS — M25512 Pain in left shoulder: Secondary | ICD-10-CM | POA: Diagnosis not present

## 2014-07-19 DIAGNOSIS — M6281 Muscle weakness (generalized): Secondary | ICD-10-CM | POA: Diagnosis not present

## 2014-07-20 DIAGNOSIS — L2089 Other atopic dermatitis: Secondary | ICD-10-CM | POA: Diagnosis not present

## 2014-07-21 DIAGNOSIS — M7502 Adhesive capsulitis of left shoulder: Secondary | ICD-10-CM | POA: Diagnosis not present

## 2014-07-21 DIAGNOSIS — M6281 Muscle weakness (generalized): Secondary | ICD-10-CM | POA: Diagnosis not present

## 2014-07-21 DIAGNOSIS — M25512 Pain in left shoulder: Secondary | ICD-10-CM | POA: Diagnosis not present

## 2014-07-21 DIAGNOSIS — M25612 Stiffness of left shoulder, not elsewhere classified: Secondary | ICD-10-CM | POA: Diagnosis not present

## 2014-07-24 DIAGNOSIS — M6281 Muscle weakness (generalized): Secondary | ICD-10-CM | POA: Diagnosis not present

## 2014-07-24 DIAGNOSIS — M7502 Adhesive capsulitis of left shoulder: Secondary | ICD-10-CM | POA: Diagnosis not present

## 2014-07-24 DIAGNOSIS — M25612 Stiffness of left shoulder, not elsewhere classified: Secondary | ICD-10-CM | POA: Diagnosis not present

## 2014-07-24 DIAGNOSIS — M25512 Pain in left shoulder: Secondary | ICD-10-CM | POA: Diagnosis not present

## 2014-07-24 DIAGNOSIS — L2089 Other atopic dermatitis: Secondary | ICD-10-CM | POA: Diagnosis not present

## 2014-07-28 DIAGNOSIS — M25572 Pain in left ankle and joints of left foot: Secondary | ICD-10-CM | POA: Diagnosis not present

## 2014-07-31 DIAGNOSIS — M25612 Stiffness of left shoulder, not elsewhere classified: Secondary | ICD-10-CM | POA: Diagnosis not present

## 2014-07-31 DIAGNOSIS — M7502 Adhesive capsulitis of left shoulder: Secondary | ICD-10-CM | POA: Diagnosis not present

## 2014-07-31 DIAGNOSIS — M6281 Muscle weakness (generalized): Secondary | ICD-10-CM | POA: Diagnosis not present

## 2014-07-31 DIAGNOSIS — M25512 Pain in left shoulder: Secondary | ICD-10-CM | POA: Diagnosis not present

## 2014-08-01 ENCOUNTER — Ambulatory Visit (INDEPENDENT_AMBULATORY_CARE_PROVIDER_SITE_OTHER): Payer: Medicare Other | Admitting: Neurology

## 2014-08-01 DIAGNOSIS — G479 Sleep disorder, unspecified: Secondary | ICD-10-CM

## 2014-08-01 DIAGNOSIS — G4733 Obstructive sleep apnea (adult) (pediatric): Secondary | ICD-10-CM | POA: Diagnosis not present

## 2014-08-01 DIAGNOSIS — G4734 Idiopathic sleep related nonobstructive alveolar hypoventilation: Secondary | ICD-10-CM

## 2014-08-01 NOTE — Sleep Study (Signed)
Please see the scanned sleep study interpretation located in the Procedure tab within the Chart Review section. 

## 2014-08-02 DIAGNOSIS — M25612 Stiffness of left shoulder, not elsewhere classified: Secondary | ICD-10-CM | POA: Diagnosis not present

## 2014-08-02 DIAGNOSIS — M7502 Adhesive capsulitis of left shoulder: Secondary | ICD-10-CM | POA: Diagnosis not present

## 2014-08-02 DIAGNOSIS — M6281 Muscle weakness (generalized): Secondary | ICD-10-CM | POA: Diagnosis not present

## 2014-08-02 DIAGNOSIS — M25512 Pain in left shoulder: Secondary | ICD-10-CM | POA: Diagnosis not present

## 2014-08-03 DIAGNOSIS — L814 Other melanin hyperpigmentation: Secondary | ICD-10-CM | POA: Diagnosis not present

## 2014-08-07 DIAGNOSIS — M7502 Adhesive capsulitis of left shoulder: Secondary | ICD-10-CM | POA: Diagnosis not present

## 2014-08-07 DIAGNOSIS — M25512 Pain in left shoulder: Secondary | ICD-10-CM | POA: Diagnosis not present

## 2014-08-08 DIAGNOSIS — M25612 Stiffness of left shoulder, not elsewhere classified: Secondary | ICD-10-CM | POA: Diagnosis not present

## 2014-08-08 DIAGNOSIS — M6281 Muscle weakness (generalized): Secondary | ICD-10-CM | POA: Diagnosis not present

## 2014-08-08 DIAGNOSIS — M7502 Adhesive capsulitis of left shoulder: Secondary | ICD-10-CM | POA: Diagnosis not present

## 2014-08-08 DIAGNOSIS — M25512 Pain in left shoulder: Secondary | ICD-10-CM | POA: Diagnosis not present

## 2014-08-09 ENCOUNTER — Telehealth: Payer: Self-pay | Admitting: Neurology

## 2014-08-09 DIAGNOSIS — G4733 Obstructive sleep apnea (adult) (pediatric): Secondary | ICD-10-CM

## 2014-08-09 DIAGNOSIS — G4734 Idiopathic sleep related nonobstructive alveolar hypoventilation: Secondary | ICD-10-CM

## 2014-08-09 NOTE — Telephone Encounter (Signed)
Please call and notify the patient that the recent sleep study did confirm the diagnosis of obstructive sleep apnea and due to her lower oxygen values, her snoring, her medical history and her sleep related complaints, I recommend treatment for this in the form of CPAP. This will require a repeat sleep study for proper titration and mask fitting. Please explain to patient and arrange for a CPAP titration study. I have placed an order in the chart. Thanks, Star Age, MD, PhD [facility])

## 2014-08-14 ENCOUNTER — Encounter: Payer: Self-pay | Admitting: Neurology

## 2014-08-14 DIAGNOSIS — M25512 Pain in left shoulder: Secondary | ICD-10-CM | POA: Diagnosis not present

## 2014-08-14 DIAGNOSIS — M6281 Muscle weakness (generalized): Secondary | ICD-10-CM | POA: Diagnosis not present

## 2014-08-14 DIAGNOSIS — M7502 Adhesive capsulitis of left shoulder: Secondary | ICD-10-CM | POA: Diagnosis not present

## 2014-08-14 DIAGNOSIS — M25612 Stiffness of left shoulder, not elsewhere classified: Secondary | ICD-10-CM | POA: Diagnosis not present

## 2014-08-14 NOTE — Telephone Encounter (Signed)
Patient was contacted and provided the results of her overnight sleep study that did reveal the presence of sleep apnea.  Patient was advised that a CPAP titration study had been recommended by Dr. Rexene Alberts to treat her sleep apnea.  Patient was in agreement and the CPAP study was scheduled for 08/20/2013 at 8:00 pm.  The patient gave verbal permission to mail a copy of her test results.  Dr. Unice Cobble was faxed a copy of the report.

## 2014-08-16 DIAGNOSIS — M25512 Pain in left shoulder: Secondary | ICD-10-CM | POA: Diagnosis not present

## 2014-08-16 DIAGNOSIS — M7502 Adhesive capsulitis of left shoulder: Secondary | ICD-10-CM | POA: Diagnosis not present

## 2014-08-16 DIAGNOSIS — M6281 Muscle weakness (generalized): Secondary | ICD-10-CM | POA: Diagnosis not present

## 2014-08-16 DIAGNOSIS — M25612 Stiffness of left shoulder, not elsewhere classified: Secondary | ICD-10-CM | POA: Diagnosis not present

## 2014-08-20 ENCOUNTER — Ambulatory Visit (INDEPENDENT_AMBULATORY_CARE_PROVIDER_SITE_OTHER): Payer: Medicare Other | Admitting: Neurology

## 2014-08-20 DIAGNOSIS — G4733 Obstructive sleep apnea (adult) (pediatric): Secondary | ICD-10-CM

## 2014-08-20 DIAGNOSIS — G479 Sleep disorder, unspecified: Secondary | ICD-10-CM

## 2014-08-20 NOTE — Sleep Study (Signed)
Please see the scanned sleep study interpretation located in the Procedure tab within the Chart Review section. 

## 2014-08-25 DIAGNOSIS — M6281 Muscle weakness (generalized): Secondary | ICD-10-CM | POA: Diagnosis not present

## 2014-08-25 DIAGNOSIS — M25512 Pain in left shoulder: Secondary | ICD-10-CM | POA: Diagnosis not present

## 2014-08-25 DIAGNOSIS — M25612 Stiffness of left shoulder, not elsewhere classified: Secondary | ICD-10-CM | POA: Diagnosis not present

## 2014-08-25 DIAGNOSIS — M7502 Adhesive capsulitis of left shoulder: Secondary | ICD-10-CM | POA: Diagnosis not present

## 2014-08-30 ENCOUNTER — Telehealth: Payer: Self-pay | Admitting: Neurology

## 2014-08-30 DIAGNOSIS — G4733 Obstructive sleep apnea (adult) (pediatric): Secondary | ICD-10-CM

## 2014-08-30 NOTE — Telephone Encounter (Signed)
Please call and inform patient that I have entered an order for treatment with PAP. She did well during the latest sleep study with CPAP. We will, therefore, arrange for a machine for home use through a DME (durable medical equipment) company of Her choice; and I will see the patient back in follow-up in about 6 weeks. Please also explain to the patient that I will be looking out for compliance data downloaded from the machine, which can be done remotely through a modem at times or stored on an SD card in the back of the machine. At the time of the followup appointment we will discuss sleep study results and how it is going with PAP treatment at home. Please advise patient to bring Her machine at the time of the visit; at least for the first visit, even though this is cumbersome. Bringing the machine for every visit after that may not be needed, but often helps for the first visit. Please also make sure, the patient has a follow-up appointment with me in about 6 weeks from the setup date, thanks.   Adylene Dlugosz, MD, PhD [facility] ([facility])  

## 2014-08-31 DIAGNOSIS — M7502 Adhesive capsulitis of left shoulder: Secondary | ICD-10-CM | POA: Diagnosis not present

## 2014-08-31 DIAGNOSIS — M25612 Stiffness of left shoulder, not elsewhere classified: Secondary | ICD-10-CM | POA: Diagnosis not present

## 2014-08-31 DIAGNOSIS — M25512 Pain in left shoulder: Secondary | ICD-10-CM | POA: Diagnosis not present

## 2014-08-31 DIAGNOSIS — M6281 Muscle weakness (generalized): Secondary | ICD-10-CM | POA: Diagnosis not present

## 2014-09-01 ENCOUNTER — Encounter: Payer: Self-pay | Admitting: Neurology

## 2014-09-02 ENCOUNTER — Encounter: Payer: Self-pay | Admitting: Internal Medicine

## 2014-09-02 DIAGNOSIS — G4733 Obstructive sleep apnea (adult) (pediatric): Secondary | ICD-10-CM | POA: Insufficient documentation

## 2014-09-04 NOTE — Telephone Encounter (Signed)
Patient was contacted and provided the results of her CPAP titration study which was considered effective in treating the patient's sleep apnea.  Patient was in agreement with beginning the therapy at home and was referred to [place] for CPAP set up.  The patient gave verbal permission to mail a copy of her test results.  Dr. Unice Cobble was routed a copy of the results.   Patient instructed to contact our office 6-8 weeks post set up to schedule a follow up appointment.

## 2014-09-05 ENCOUNTER — Encounter: Payer: Self-pay | Admitting: *Deleted

## 2014-09-12 ENCOUNTER — Ambulatory Visit: Payer: Medicare Other | Admitting: Internal Medicine

## 2014-09-18 ENCOUNTER — Ambulatory Visit: Payer: Medicare Other | Admitting: Internal Medicine

## 2014-09-20 ENCOUNTER — Ambulatory Visit: Payer: Medicare Other | Admitting: Internal Medicine

## 2014-09-21 DIAGNOSIS — M25512 Pain in left shoulder: Secondary | ICD-10-CM | POA: Diagnosis not present

## 2014-09-21 DIAGNOSIS — G4701 Insomnia due to medical condition: Secondary | ICD-10-CM | POA: Diagnosis not present

## 2014-09-21 DIAGNOSIS — M797 Fibromyalgia: Secondary | ICD-10-CM | POA: Diagnosis not present

## 2014-09-21 DIAGNOSIS — M542 Cervicalgia: Secondary | ICD-10-CM | POA: Diagnosis not present

## 2014-09-26 ENCOUNTER — Ambulatory Visit (INDEPENDENT_AMBULATORY_CARE_PROVIDER_SITE_OTHER): Payer: Medicare Other | Admitting: Internal Medicine

## 2014-09-26 ENCOUNTER — Encounter: Payer: Self-pay | Admitting: Internal Medicine

## 2014-09-26 VITALS — BP 118/70 | HR 82 | Temp 98.0°F | Resp 18 | Ht 65.0 in | Wt 197.0 lb

## 2014-09-26 DIAGNOSIS — G4733 Obstructive sleep apnea (adult) (pediatric): Secondary | ICD-10-CM | POA: Diagnosis not present

## 2014-09-26 DIAGNOSIS — E669 Obesity, unspecified: Secondary | ICD-10-CM

## 2014-09-26 DIAGNOSIS — G4700 Insomnia, unspecified: Secondary | ICD-10-CM

## 2014-09-26 DIAGNOSIS — M797 Fibromyalgia: Secondary | ICD-10-CM

## 2014-09-26 DIAGNOSIS — E785 Hyperlipidemia, unspecified: Secondary | ICD-10-CM | POA: Diagnosis not present

## 2014-09-26 DIAGNOSIS — E039 Hypothyroidism, unspecified: Secondary | ICD-10-CM

## 2014-09-26 DIAGNOSIS — L111 Transient acantholytic dermatosis [Grover]: Secondary | ICD-10-CM | POA: Diagnosis not present

## 2014-09-26 DIAGNOSIS — Z0000 Encounter for general adult medical examination without abnormal findings: Secondary | ICD-10-CM

## 2014-09-26 MED ORDER — NITROGLYCERIN 0.4 MG SL SUBL: 0.4000 mg | SUBLINGUAL_TABLET | SUBLINGUAL | Status: DC | PRN

## 2014-09-26 MED ORDER — ETHACRYNIC ACID 25 MG PO TABS: 25.0000 mg | ORAL_TABLET | Freq: Every day | ORAL | Status: DC | PRN

## 2014-09-26 NOTE — Progress Notes (Signed)
Pre visit review using our clinic review tool, if applicable. No additional management support is needed unless otherwise documented below in the visit note. 

## 2014-09-26 NOTE — Patient Instructions (Addendum)
We have put in for the blood work so you can come get that done when you are fasting. We have also given you exercises for trochanteric bursitis in the hips.   We will call you back about the blood results and see you back in about 6-12 months. If you have any problems or questions please call us sooner. I will look into the naltrexone to see what evidence there is for or against its use.   Trochanteric Bursitis You have hip pain due to trochanteric bursitis. Bursitis means that the sack near the outside of the hip is filled with fluid and inflamed. This sack is made up of protective soft tissue. The pain from trochanteric bursitis can be severe and keep you from sleep. It can radiate to the buttocks or down the outside of the thigh to the knee. The pain is almost always worse when rising from the seated or lying position and with walking. Pain can improve after you take a few steps. It happens more often in people with hip joint and lumbar spine problems, such as arthritis or previous surgery. Very rarely the trochanteric bursa can become infected, and antibiotics and/or surgery may be needed. Treatment often includes an injection of local anesthetic mixed with cortisone medicine. This medicine is injected into the area where it is most tender over the hip. Repeat injections may be necessary if the response to treatment is slow. You can apply ice packs over the tender area for 30 minutes every 2 hours for the next few days. Anti-inflammatory and/or narcotic pain medicine may also be helpful. Limit your activity for the next few days if the pain continues. See your caregiver in 5-10 days if you are not greatly improved.  SEEK IMMEDIATE MEDICAL CARE IF:  You develop severe pain, fever, or increased redness.  You have pain that radiates below the knee. EXERCISES STRETCHING EXERCISES - Trochanteric Bursitis  These exercises may help you when beginning to rehabilitate your injury. Your symptoms may resolve  with or without further involvement from your physician, physical therapist, or athletic trainer. While completing these exercises, remember:   Restoring tissue flexibility helps normal motion to return to the joints. This allows healthier, less painful movement and activity.  An effective stretch should be held for at least 30 seconds.  A stretch should never be painful. You should only feel a gentle lengthening or release in the stretched tissue. STRETCH - Iliotibial Band  On the floor or bed, lie on your side so your injured leg is on top. Bend your knee and grab your ankle.  Slowly bring your knee back so that your thigh is in line with your trunk. Keep your heel at your buttocks and gently arch your back so your head, shoulders and hips line up.  Slowly lower your leg so that your knee approaches the floor/bed until you feel a gentle stretch on the outside of your thigh. If you do not feel a stretch and your knee will not fall farther, place the heel of your opposite foot on top of your knee and pull your thigh down farther.  Hold this stretch for __________ seconds.  Repeat __________ times. Complete this exercise __________ times per day. STRETCH - Hamstrings, Supine   Lie on your back. Loop a belt or towel over the ball of your foot as shown.  Straighten your knee and slowly pull on the belt to raise your injured leg. Do not allow the knee to bend. Keep your opposite  leg flat on the floor.  Raise the leg until you feel a gentle stretch behind your knee or thigh. Hold this position for __________ seconds.  Repeat __________ times. Complete this stretch __________ times per day. STRETCH - Quadriceps, Prone   Lie on your stomach on a firm surface, such as a bed or padded floor.  Bend your knee and grasp your ankle. If you are unable to reach your ankle or pant leg, use a belt around your foot to lengthen your reach.  Gently pull your heel toward your buttocks. Your knee should  not slide out to the side. You should feel a stretch in the front of your thigh and/or knee.  Hold this position for __________ seconds.  Repeat __________ times. Complete this stretch __________ times per day. STRETCHING - Hip Flexors, Lunge Half kneel with your knee on the floor and your opposite knee bent and directly over your ankle.  Keep good posture with your head over your shoulders. Tighten your buttocks to point your tailbone downward; this will prevent your back from arching too much.  You should feel a gentle stretch in the front of your thigh and/or hip. If you do not feel any resistance, slightly slide your opposite foot forward and then slowly lunge forward so your knee once again lines up over your ankle. Be sure your tailbone remains pointed downward.  Hold this stretch for __________ seconds.  Repeat __________ times. Complete this stretch __________ times per day. STRETCH - Adductors, Lunge  While standing, spread your legs.  Lean away from your injured leg by bending your opposite knee. You may rest your hands on your thigh for balance.  You should feel a stretch in your inner thigh. Hold for __________ seconds.  Repeat __________ times. Complete this exercise __________ times per day. Document Released: 09/11/2004 Document Revised: 12/19/2013 Document Reviewed: 11/16/2008 [facility] Patient Information 2015 [place], [place]. This information is not intended to replace advice given to you by your health care provider. Make sure you discuss any questions you have with your health care provider.

## 2014-09-27 ENCOUNTER — Encounter: Payer: Self-pay | Admitting: Internal Medicine

## 2014-09-27 DIAGNOSIS — Z0000 Encounter for general adult medical examination without abnormal findings: Secondary | ICD-10-CM | POA: Insufficient documentation

## 2014-09-27 DIAGNOSIS — E669 Obesity, unspecified: Secondary | ICD-10-CM | POA: Insufficient documentation

## 2014-09-27 NOTE — Assessment & Plan Note (Signed)
She is currently seeing an endocrinologist and will check thyroid levels today and forward to her to take to visit. No overt symptoms of under or over replacement.

## 2014-09-27 NOTE — Assessment & Plan Note (Signed)
Unclear if this is a true diagnosis but she identifies with it. She is not on any treatment for it. Surprisingly no allergy noted to cymbalta, lyrica, gabapentin. If she does have flare will try to talk to her about starting some targeted treatment.

## 2014-09-27 NOTE — Progress Notes (Signed)
   Subjective:    Patient ID: Andrea Mckay, female    DOB: 12-10-1945, 69 y.o.   MRN: [id number]  HPI  The patient is a 69 YO female who is new today and coming in and wants to have her skin checked. She had an allergic reaction to a medication about 6-12 months ago. She had an eruption of red spots which she isn't sure ever went away. At times she still feels itchy. She has not taken that medication again. Initially the itching was severe and persistent and now she is only having mild occasional itching. She also has some other concerns that we are able to address. Her mom has history of CHF and she wants to make sure she doesn't have that. See A and P for status and treatment of her chronic problems.   PMH, [place], social history, PSH, allergies, medications, problem list reviewed and updated at today's visit.   Review of Systems  Constitutional: Negative for fever, activity change, appetite change, fatigue and unexpected weight change.  HENT: Negative.   Eyes: Negative.   Respiratory: Negative for cough, chest tightness, shortness of breath and wheezing.   Cardiovascular: Negative for chest pain, palpitations and leg swelling.  Gastrointestinal: Negative for nausea, abdominal pain, diarrhea, constipation and abdominal distention.  Musculoskeletal: Positive for myalgias. Negative for back pain and arthralgias.  Skin: Positive for rash.  Neurological: Negative.   Psychiatric/Behavioral: Negative.       Objective:   Physical Exam  Constitutional: She is oriented to person, place, and time. She appears well-developed and well-nourished.  Overweight  HENT:  Head: Normocephalic and atraumatic.  Eyes: EOM are normal.  Neck: Normal range of motion.  Cardiovascular: Normal rate and regular rhythm.   Pulmonary/Chest: Effort normal and breath sounds normal. No respiratory distress. She has no wheezes. She has no rales.  Abdominal: Soft. Bowel sounds are normal. She exhibits no distension.  There is no tenderness. There is no rebound.  Musculoskeletal: She exhibits no edema.  Neurological: She is alert and oriented to person, place, and time.  Skin: Skin is warm and dry.  Some areas of dry skin which appear slightly red (unclear if scratching).    Filed Vitals:   09/26/14 0941  BP: 118/70  Pulse: 82  Temp: 98 F (36.7 C)  TempSrc: Oral  Resp: 18  Height: 5\' 5"  (1.651 m)  Weight: 197 lb (89.359 kg)  SpO2: 98%      Assessment & Plan:

## 2014-09-27 NOTE — Assessment & Plan Note (Signed)
She continues to see dermatology and talked with her about skin moisturizers as her skin is dry on exam and likely part of the cause of her itchiness, not a medication reaction from 1 year ago.

## 2014-09-27 NOTE — Assessment & Plan Note (Signed)
She is overweight and some of her myalgias limit her exercise. We did talk about the fact that she should be exercising at least 3-4 times per week.

## 2014-09-27 NOTE — Assessment & Plan Note (Signed)
She refused any immunizations today. We did not have time to talk about mammogram and she thinks she has gotten a dexa scan but not sure when. Colonoscopy due 2022. Shingles completed. Needs pneumonia booster shot.

## 2014-09-27 NOTE — Assessment & Plan Note (Signed)
Most likely related to her unknown OSA and now on CPAP doing much better.

## 2014-09-27 NOTE — Assessment & Plan Note (Signed)
She does refuse all statins due to the side effects she has heard about. Check lipid panel.

## 2014-09-27 NOTE — Assessment & Plan Note (Signed)
Patient started CPAP about 1 week ago and is feeling much better. Much less daytime sleepiness and irritability. She is tolerating the fit well. Advised her that all adjustments can take 4-6 weeks. She is concerned about the likelihood of damage from the low oxygen levels at night time all these years. I talked with her about the fact that untreated sleep apnea does have slightly higher risk of death but no discrete changes have been described in the literature.

## 2014-09-28 ENCOUNTER — Other Ambulatory Visit (INDEPENDENT_AMBULATORY_CARE_PROVIDER_SITE_OTHER): Payer: Medicare Other

## 2014-09-28 DIAGNOSIS — M25612 Stiffness of left shoulder, not elsewhere classified: Secondary | ICD-10-CM | POA: Diagnosis not present

## 2014-09-28 DIAGNOSIS — M7502 Adhesive capsulitis of left shoulder: Secondary | ICD-10-CM | POA: Diagnosis not present

## 2014-09-28 DIAGNOSIS — E039 Hypothyroidism, unspecified: Secondary | ICD-10-CM

## 2014-09-28 DIAGNOSIS — M6281 Muscle weakness (generalized): Secondary | ICD-10-CM | POA: Diagnosis not present

## 2014-09-28 DIAGNOSIS — M25512 Pain in left shoulder: Secondary | ICD-10-CM | POA: Diagnosis not present

## 2014-09-28 LAB — COMPREHENSIVE METABOLIC PANEL
ALK PHOS: 106 U/L (ref 39–117)
ALT: 31 U/L (ref 0–35)
AST: 20 U/L (ref 0–37)
Albumin: 4 g/dL (ref 3.5–5.2)
BUN: 13 mg/dL (ref 6–23)
CALCIUM: 9.3 mg/dL (ref 8.4–10.5)
CHLORIDE: 106 meq/L (ref 96–112)
CO2: 28 mEq/L (ref 19–32)
CREATININE: 0.68 mg/dL (ref 0.40–1.20)
GFR: 91.09 mL/min (ref >60.00)
Glucose, Bld: 109 mg/dL — ABNORMAL HIGH (ref 70–99)
POTASSIUM: 4.1 meq/L (ref 3.5–5.1)
Sodium: 140 mEq/L (ref 135–145)
Total Bilirubin: 0.4 mg/dL (ref 0.2–1.2)
Total Protein: 6.8 g/dL (ref 6.0–8.3)

## 2014-09-28 LAB — LIPID PANEL
CHOLESTEROL: 237 mg/dL — AB (ref 0–200)
HDL: 64.7 mg/dL (ref >39.00)
LDL CALC: 149 mg/dL — AB (ref 0–99)
NonHDL: 172.3
TRIGLYCERIDES: 116 mg/dL (ref 0.0–149.0)
Total CHOL/HDL Ratio: 4
VLDL: 23.2 mg/dL (ref 0.0–40.0)

## 2014-09-28 LAB — TSH: TSH: 1.09 u[IU]/mL (ref 0.35–4.50)

## 2014-09-28 LAB — T4, FREE: Free T4: 0.63 ng/dL (ref 0.60–1.60)

## 2014-10-04 DIAGNOSIS — M25512 Pain in left shoulder: Secondary | ICD-10-CM | POA: Diagnosis not present

## 2014-10-04 DIAGNOSIS — M7502 Adhesive capsulitis of left shoulder: Secondary | ICD-10-CM | POA: Diagnosis not present

## 2014-10-04 DIAGNOSIS — M25612 Stiffness of left shoulder, not elsewhere classified: Secondary | ICD-10-CM | POA: Diagnosis not present

## 2014-10-04 DIAGNOSIS — M6281 Muscle weakness (generalized): Secondary | ICD-10-CM | POA: Diagnosis not present

## 2014-10-12 DIAGNOSIS — M25512 Pain in left shoulder: Secondary | ICD-10-CM | POA: Diagnosis not present

## 2014-10-12 DIAGNOSIS — M25612 Stiffness of left shoulder, not elsewhere classified: Secondary | ICD-10-CM | POA: Diagnosis not present

## 2014-10-12 DIAGNOSIS — M7502 Adhesive capsulitis of left shoulder: Secondary | ICD-10-CM | POA: Diagnosis not present

## 2014-10-12 DIAGNOSIS — M6281 Muscle weakness (generalized): Secondary | ICD-10-CM | POA: Diagnosis not present

## 2014-10-19 DIAGNOSIS — M25512 Pain in left shoulder: Secondary | ICD-10-CM | POA: Diagnosis not present

## 2014-10-19 DIAGNOSIS — M25612 Stiffness of left shoulder, not elsewhere classified: Secondary | ICD-10-CM | POA: Diagnosis not present

## 2014-10-19 DIAGNOSIS — M6281 Muscle weakness (generalized): Secondary | ICD-10-CM | POA: Diagnosis not present

## 2014-10-19 DIAGNOSIS — M7502 Adhesive capsulitis of left shoulder: Secondary | ICD-10-CM | POA: Diagnosis not present

## 2014-10-20 ENCOUNTER — Encounter: Payer: Self-pay | Admitting: Neurology

## 2014-10-24 ENCOUNTER — Encounter: Payer: Self-pay | Admitting: Neurology

## 2014-10-27 ENCOUNTER — Telehealth: Payer: Self-pay | Admitting: Internal Medicine

## 2014-10-27 NOTE — Telephone Encounter (Signed)
Patient states edecrin is in her Tier 4.  She is allergic to all generic forms of this med.  She is requesting that an appeal to be processed with her insurance company.

## 2014-11-01 ENCOUNTER — Encounter: Payer: Self-pay | Admitting: Internal Medicine

## 2014-11-01 ENCOUNTER — Telehealth: Payer: Self-pay

## 2014-11-01 NOTE — Telephone Encounter (Signed)
Pt is  allergic to flu shot, Per Dr hopper should not get one

## 2014-11-01 NOTE — Telephone Encounter (Signed)
Left message for pt to call back if she wants flu vaccine

## 2014-11-09 ENCOUNTER — Encounter: Payer: Self-pay | Admitting: Neurology

## 2014-11-09 ENCOUNTER — Ambulatory Visit (INDEPENDENT_AMBULATORY_CARE_PROVIDER_SITE_OTHER): Payer: Medicare Other | Admitting: Neurology

## 2014-11-09 ENCOUNTER — Encounter: Payer: Self-pay | Admitting: Internal Medicine

## 2014-11-09 VITALS — BP 123/70 | HR 74 | Temp 98.6°F | Resp 16 | Ht 67.0 in | Wt 195.0 lb

## 2014-11-09 DIAGNOSIS — Z9989 Dependence on other enabling machines and devices: Principal | ICD-10-CM

## 2014-11-09 DIAGNOSIS — G2581 Restless legs syndrome: Secondary | ICD-10-CM

## 2014-11-09 DIAGNOSIS — G4733 Obstructive sleep apnea (adult) (pediatric): Secondary | ICD-10-CM

## 2014-11-09 DIAGNOSIS — D519 Vitamin B12 deficiency anemia, unspecified: Secondary | ICD-10-CM

## 2014-11-09 DIAGNOSIS — R351 Nocturia: Secondary | ICD-10-CM

## 2014-11-09 DIAGNOSIS — E559 Vitamin D deficiency, unspecified: Secondary | ICD-10-CM

## 2014-11-09 NOTE — Patient Instructions (Addendum)

## 2014-11-09 NOTE — Progress Notes (Signed)
Subjective:    Patient ID: Andrea Mckay is a 69 y.o. female.  HPI     Interim history:   Andrea Mckay is a 69 year old right-handed woman with an underlying medical history of hyperlipidemia, hypothyroidism, mitral valve prolapse, fibromyalgia (saw Dr. Estanislado Pandy) and obesity, who presents for follow-up consultation of her obstructive sleep apnea, after her recent sleep studies. The patient is unaccompanied today. I first met her on 06/13/2014 at the request of her primary care provider, at which time she reported difficulty with sleep maintenance, snoring and witnessed apneas. I invited her back for sleep study. She had a baseline sleep study on 08/01/2014 followed by a CPAP titration study on 08/20/2014 and underwent over her test results in detail with her today. Her baseline sleep study from 08/01/2014 showed a sleep efficiency of 85.5% with a latency to sleep of 14 minutes and wake after sleep onset of 56 minutes with moderate to severe sleep fragmentation noted. She had an increased percentage of stage II sleep, 14.3% of deep sleep, and a mildly reduced percentage of REM sleep at 18.1% with a borderline reduced REM latency at 69 minutes. She had no significant periodic leg movements of sleep or EKG changes or EEG changes. She had moderate to loud snoring. She had a total AHI of 6.6 per hour, rising to 17.7 per hour during REM sleep. Her average oxygen saturation was in the high 80s. Supplemental oxygen was started at 1 L/m to help maintain her oxygen saturations in the 90s. With supplemental oxygen her average oxygen saturation was 90%, and her desaturation nadir reached 81%. Based on her desaturations I invited her back for CPAP titration. She had this on 08/20/2014. Latency to sleep was 21 minutes, wake after sleep onset was 84 minutes with moderate sleep fragmentation noted. She had a normal arousal index. She had an increased percentage of stage II sleep. She had 9.5% of deep sleep and a normal  percentage of REM sleep at 20.1%. She had no significant PLMS. She had no significant EKG changes or EEG changes. Baseline oxygen saturation was 93%, nadir was 86%. CPAP was started at 5 cm with a small nasal pillows interface. CPAP was titrated to a pressure of 11 cm and the AHI was reduced to 0.6 per hour at the pressure of 10 cm. The study was conducted without supplemental oxygen. Based on her test results are prescribed CPAP therapy for home use.   Today, 11/09/2014: I reviewed her CPAP compliance data from 10/08/2014 through 11/06/2014 which is a total of 30 days during which time she used her machine every night with percent used days greater than 4 hours of 100%, indicating superb compliance with an average usage of 7 hours and 39 minutes, residual AHI low at 1.9 per hour on a pressure of 10 cm with EPR of 3 and leak acceptable with the 95th percentile at 13.6 L/m.   Today, 11/09/2014: She reports feeling much improved with regards to her sleep. She also feels that her concentration mood irritability are much better. She has noted better sleep consolidation and less daytime somnolence. She has noted improvement in her occasional restless leg symptoms. She is fully compliant with treatment. She is using nasal pillows which have in the beginning irritated her nostrils but this has improved. Her nocturia is about the same. She has suffered from dry eyes before. This has exacerbated a little bit since CPAP usage. She drinks water during the night. She has nocturia once per night. With respect  to her lungs, she has a history of recurrent pneumonias for years. She has seen a pulmonologist in the past but was not diagnosed with any chronic lung disease. She did not have much in the way of exposure to smoking except for during her work life. She never smoked herself. She did smoke marijuana for about 20 years but quit many years ago. She has a history of fluid retention and in the past when she was still in  [place] she saw a cardiologist. She had cardiac workup. She had workup for lower extremity swelling. She had venous Doppler testing, echocardiogram and a thallium scan, all of which per her report were unremarkable. She has a history of mitral valve prolapse however.  Previously:  She moved from [place] a month ago. She has intermittent RLS symptoms and has tried herbal and natural remedies. She sleeps alone. She has been told, that snoring can be loud. She has never had a sleep study. She is very sensitive to medications and may have tried trazodone one time, which gave her a very bad "hangover". She has woken up from her own snoring and from her leg kicking and leg jerking. She denies morning HAs. She has been on H1 and H2 blockers for an itchy rash and has been sleeping better. She may nap sometimes. She has an ESS of 2/24 today, her fatigue score is 4. She drinks coffee in the morning and green tea before 3 PM.   She has nocturia x 1 per night.   Her mother has OSA and is on CPAP.    Her typical bedtime is reported to be around 8 or 9 PM and usual wake time is around 4 to 5 AM, because she is awake. 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 She feels adequately rested upon awakening. She wakes up on an average 1 times in the middle of the night and has to go to the bathroom 1 times on a typical night. She denies morning headaches. She has not fallen asleep while driving. She has no nighttime reflux; she has nasal congestion.   She is a restless sleeper and in the morning, the bed is quite disheveled.    She denies cataplexy, sleep paralysis, hypnagogic or hypnopompic hallucinations, or sleep attacks. She does not report any vivid dreams, nightmares, dream enactments, or parasomnias, such as sleep talking or sleep walking. The patient has not had a sleep study or a home sleep test. Her bedroom is usually dark and cool. There is no TV in the bedroom.   She  is a non-smoker and drinks alcohol occasionally.   Her Past Medical History Is Significant For: Past Medical History  Diagnosis Date  . Myalgia and myositis, unspecified   . Cancer 01/2006    melanoma  . Thyroid disease     hypothyroid  . Postmenopausal   . Goiter   . Fibromyalgia   . Kidney stone   . Allergy   . Arthritis     osteoarthritis  . Diabetes mellitus     diet controlled  . Hyperlipidemia   . Osteoporosis     osteopenia  . Heart murmur   . Foreign body     foot  . Diarrhea   . Candidiasis     vagina/vulva  . Tick bite   . Malignant melanoma   . S/P colonoscopy   . Coronary artery disease     Her Past Surgical History Is  Significant For: Past Surgical History  Procedure Laterality Date  . Thyroidectomy  1982  . Abdominal hysterectomy  1987  . Knee arthroscopy w/ acl reconstruction    . Foot tendon surgery  1982    Left  . Ovarian cyst removal  1999    removed b/l   . Lipoma excision  01/20/2011    Her Family History Is Significant For: Family History  Problem Relation Age of Onset  . Cancer Paternal Grandmother     pancreatic  . Pancreatic cancer Paternal Grandmother   . Diabetes Maternal Aunt   . Prostate cancer    . Atrial fibrillation Mother   . Hypertension Mother   . Colon polyps Mother   . Colon polyps Daughter     Adenomatous  . Cancer Father 80    prostate  . Parkinsonism Paternal Uncle   . Heart disease Maternal Grandmother 75    MI  . Cancer Maternal Aunt 80    colon    Her Social History Is Significant For: History   Social History  . Marital Status: Divorced    Spouse Name: N/A  . Number of Children: N/A  . Years of Education: N/A   Occupational History  . accounting P/T    Social History Main Topics  . Smoking status: Never Smoker   . Smokeless tobacco: Never Used  . Alcohol Use: 0.0 oz/week    0 Standard drinks or equivalent per week     Comment: Socially  . Drug Use: No  . Sexual Activity: No   Other  Topics Concern  . None   Social History Narrative    Her Allergies Are:  Allergies  Allergen Reactions  . Erythromycin Rash  . Iodine Nausea Only, Rash and Other (See Comments)    arrythmia  . Latex Rash and Cough  . Sulfonamide Derivatives   . Cephalosporins   . Ciprofloxacin   . Doxycycline   . Iohexol   . Lasix [Furosemide]   . Moxifloxacin   . Nabumetone   . Nitrofurantoin   . Nsaids Other (See Comments)    Told to avoid because of kidneys  . Ofloxacin   . Oxaprozin   . Quinolones   . Risedronate Sodium   . Soy Allergy   . Spironolactone   . Levothyroxine Sodium Palpitations  :   Her Current Medications Are:  Outpatient Encounter Prescriptions as of 11/09/2014  Medication Sig  . aspirin 81 MG tablet Take 81 mg by mouth daily.  Marland Kitchen EPINEPHrine (EPIPEN) 0.3 mg/0.3 mL DEVI Inject 0.3 mg into the muscle once.    . ethacrynic acid (EDECRIN) 25 MG tablet Take 1 tablet (25 mg total) by mouth daily as needed. Take as needed for fluid retention.  . nitroGLYCERIN (NITROSTAT) 0.4 MG SL tablet Place 1 tablet (0.4 mg total) under the tongue every 5 (five) minutes as needed.  . NON FORMULARY Eucerin PRN    . thyroid (NATURE-THROID) 130 MG tablet Take 130 mg by mouth daily.    Marland Kitchen UNABLE TO FIND Med Name: H2 Inhibitor  . [DISCONTINUED] ondansetron (ZOFRAN) 4 MG tablet Take 4 mg by mouth as directed.    . [DISCONTINUED] ranitidine (ZANTAC) 150 MG tablet Take 1 tablet (150 mg total) by mouth 2 (two) times daily. (Patient not taking: Reported on 09/26/2014)  :  Review of Systems:  Out of a complete 14 point review of systems, all are reviewed and negative with the exception of these symptoms as listed below:  Review of Systems  Constitutional: Positive for fatigue.       Weight gain   Neurological:       Takes a nap around 4pm     Objective:  Neurologic Exam  Physical Exam Physical Examination:   Filed Vitals:   11/09/14 1125  BP: 123/70  Pulse: 74  Temp: 98.6 F (37  C)  Resp: 16    General Examination: The patient is a very pleasant 69 y.o. female in no acute distress. She appears well-developed and well-nourished and well groomed. She is in good spirits today.  HEENT: Normocephalic, atraumatic, pupils are equal, round and reactive to light and accommodation. Funduscopic exam is normal with sharp disc margins noted. Extraocular tracking is good without limitation to gaze excursion or nystagmus noted. Normal smooth pursuit is noted. Hearing is grossly intact. Tympanic membranes are clear bilaterally. Face is symmetric with normal facial animation and normal facial sensation. Speech is clear with no dysarthria noted. There is no hypophonia. There is no lip, neck/head, jaw or voice tremor. Neck is supple with full range of passive and active motion. There are no carotid bruits on auscultation. Oropharynx exam reveals: Mild to moderate mouth dryness, adequate dental hygiene and moderate airway crowding, due to narrow airway entry and redundant soft palate. Mallampati is class II. Tongue protrudes centrally and palate elevates symmetrically. Tonsils are 1+, R a little bigger than L. Neck size is 15 inches. She has a Mild overbite. Nasal inspection reveals no significant nasal mucosal bogginess or redness and no septal deviation. She has a very mild degree of redness underneath both nostrils.  Chest: Clear to auscultation without wheezing, rhonchi or crackles noted.  Heart: S1+S2+0, regular and normal without murmurs, rubs or gallops noted.   Abdomen: Soft, non-tender and non-distended with normal bowel sounds appreciated on auscultation.  Extremities: There is trace pitting edema in the distal lower extremities bilaterally. Pedal pulses are intact.  Skin: Warm and dry without trophic changes noted. There are no varicose veins.  Musculoskeletal: exam reveals no obvious joint deformities, tenderness or joint swelling or erythema.   Neurologically:  Mental status:  The patient is awake, alert and oriented in all 4 spheres. Her immediate and remote memory, attention, language skills and fund of knowledge are appropriate. There is no evidence of aphasia, agnosia, apraxia or anomia. Speech is clear with normal prosody and enunciation. Thought process is linear. Mood is normal and affect is normal.  Cranial nerves II - XII are as described above under HEENT exam. In addition: shoulder shrug is normal with equal shoulder height noted. Motor exam: Normal bulk, strength and tone is noted. There is no drift, tremor or rebound. Romberg is negative. Reflexes are 2+ throughout. Babinski: Toes are flexor bilaterally. Fine motor skills and coordination: intact with normal finger taps, normal hand movements, normal rapid alternating patting, normal foot taps and normal foot agility.  Cerebellar testing: No dysmetria or intention tremor on finger to nose testing. Heel to shin is unremarkable bilaterally. There is no truncal or gait ataxia.  Sensory exam: intact to light touch, pinprick, vibration, temperature sense in the upper and lower extremities.  Gait, station and balance: She stands easily. No veering to one side is noted. No leaning to one side is noted. Posture is age-appropriate and stance is narrow based. Gait shows normal stride length and normal pace. No problems turning are noted. She turns en bloc. Tandem walk is slightly difficult for her, unchanged.     Assessment and Plan:  In summary, Andrea Mckay is a very pleasant 69 year old female with an underlying medical history of hyperlipidemia, hypothyroidism, mitral valve prolapse, fibromyalgia (saw Dr. Estanislado Pandy) and obesity, who presents for follow-up consultation of her obstructive sleep apnea. Her baseline sleep study from 08/01/2014 showed overall mild obstructive sleep apnea by number of events but she had significant desaturations and hypoxemia with time below 88% saturation of over 80 minutes. Supplemental  oxygen was utilized during the first study to maintain oxygen saturations. She had a CPAP titration study which showed improved oxygen saturation just by way of using pressurized air. She has done well with CPAP therapy, has adjusted well to it and reports improved sleep, improved daytime somnolence, and sleep consolidation, and improved mood. I am very pleased with how she is doing. She is congratulated on her superb compliance. We talked about her two sleep study results in detail today and also reviewed her compliance data together. She is again advised regarding the risks and ramifications of untreated obstructive sleep apnea and significant nighttime desaturations. All of this has improved. Symptoms of untreated OSA include daytime sleepiness, memory problems, mood irritability and mood disorder such as depression and anxiety, lack of energy, as well as recurrent headaches, especially morning headaches. We talked about trying to maintain a healthy lifestyle in general, as well as the importance of weight control. I encouraged the patient to eat healthy, exercise daily and keep well hydrated, to keep a scheduled bedtime and wake time routine, to not skip any meals and eat healthy snacks in between meals. I advised the patient not to drive when feeling sleepy. I recommended the following at this time: Continue CPAP treatment regularly. She is doing well from my end of things and I would like to see her back in 6 months, sooner if the need arises.  I explained the importance of being compliant with PAP treatment, not only for insurance purposes but primarily to improve Her symptoms, and for the patient's long term health benefit, including to reduce Her cardiovascular risks. I answered all her questions today and the patient was in agreement. I encouraged her to call with any interim questions, concerns, problems or updates.  I spent 25 minutes in total face-to-face time with the patient, more than 50% of  which was spent in counseling and coordination of care, reviewing test results, reviewing medication and discussing or reviewing the diagnosis of OSA, its prognosis and treatment options.

## 2014-11-13 ENCOUNTER — Telehealth: Payer: Self-pay | Admitting: Neurology

## 2014-11-13 ENCOUNTER — Encounter: Payer: Self-pay | Admitting: Neurology

## 2014-11-13 NOTE — Telephone Encounter (Signed)
Patient is having extreme vertigo, nausea since using her chin strap(CPAP) 7 wks ago, so exhausted everyday at 4:00, can't move. Please advise.

## 2014-11-13 NOTE — Telephone Encounter (Signed)
  I spoke with Andrea Mckay, who reports that she sees a distinct difference in vertigo and nausea on nights when she uses her chinstrap for those nights when she doesn't. I asked her to keep the stitch in strep off for now. She reported that she has a very dry mouth when not using the chinstrap and I advised her to use Biotene mouthwash and a nasal CPAP gel if necessary. She is also able to adjust the humidifier of her CPAP machine. The patient was satisfied. CD

## 2014-11-21 DIAGNOSIS — L237 Allergic contact dermatitis due to plants, except food: Secondary | ICD-10-CM | POA: Diagnosis not present

## 2014-11-21 DIAGNOSIS — L299 Pruritus, unspecified: Secondary | ICD-10-CM | POA: Diagnosis not present

## 2014-11-21 DIAGNOSIS — E039 Hypothyroidism, unspecified: Secondary | ICD-10-CM | POA: Diagnosis not present

## 2014-11-21 DIAGNOSIS — E780 Pure hypercholesterolemia: Secondary | ICD-10-CM | POA: Diagnosis not present

## 2014-11-21 DIAGNOSIS — R7302 Impaired glucose tolerance (oral): Secondary | ICD-10-CM | POA: Diagnosis not present

## 2014-11-27 ENCOUNTER — Encounter: Payer: Self-pay | Admitting: Internal Medicine

## 2014-11-27 DIAGNOSIS — R7302 Impaired glucose tolerance (oral): Secondary | ICD-10-CM | POA: Diagnosis not present

## 2014-11-27 DIAGNOSIS — E890 Postprocedural hypothyroidism: Secondary | ICD-10-CM | POA: Diagnosis not present

## 2014-11-27 DIAGNOSIS — E780 Pure hypercholesterolemia: Secondary | ICD-10-CM | POA: Diagnosis not present

## 2014-11-28 ENCOUNTER — Other Ambulatory Visit: Payer: Self-pay | Admitting: Internal Medicine

## 2014-11-28 MED ORDER — ESTROGENS, CONJUGATED 0.625 MG/GM VA CREA: 1.0000 | TOPICAL_CREAM | Freq: Every day | VAGINAL | Status: DC

## 2014-11-28 MED ORDER — ETHACRYNIC ACID 25 MG PO TABS: 25.0000 mg | ORAL_TABLET | Freq: Every day | ORAL | Status: DC | PRN

## 2015-02-28 DIAGNOSIS — D2262 Melanocytic nevi of left upper limb, including shoulder: Secondary | ICD-10-CM | POA: Diagnosis not present

## 2015-02-28 DIAGNOSIS — L910 Hypertrophic scar: Secondary | ICD-10-CM | POA: Diagnosis not present

## 2015-02-28 DIAGNOSIS — D2272 Melanocytic nevi of left lower limb, including hip: Secondary | ICD-10-CM | POA: Diagnosis not present

## 2015-02-28 DIAGNOSIS — D2261 Melanocytic nevi of right upper limb, including shoulder: Secondary | ICD-10-CM | POA: Diagnosis not present

## 2015-02-28 DIAGNOSIS — L918 Other hypertrophic disorders of the skin: Secondary | ICD-10-CM | POA: Diagnosis not present

## 2015-02-28 DIAGNOSIS — L821 Other seborrheic keratosis: Secondary | ICD-10-CM | POA: Diagnosis not present

## 2015-02-28 DIAGNOSIS — D2271 Melanocytic nevi of right lower limb, including hip: Secondary | ICD-10-CM | POA: Diagnosis not present

## 2015-02-28 DIAGNOSIS — L570 Actinic keratosis: Secondary | ICD-10-CM | POA: Diagnosis not present

## 2015-03-15 ENCOUNTER — Encounter: Payer: Self-pay | Admitting: Internal Medicine

## 2015-03-15 ENCOUNTER — Ambulatory Visit (INDEPENDENT_AMBULATORY_CARE_PROVIDER_SITE_OTHER): Payer: Medicare Other | Admitting: Internal Medicine

## 2015-03-15 ENCOUNTER — Other Ambulatory Visit: Payer: Medicare Other

## 2015-03-15 VITALS — BP 144/88 | HR 81 | Temp 98.3°F | Resp 16 | Wt 203.0 lb

## 2015-03-15 DIAGNOSIS — R600 Localized edema: Secondary | ICD-10-CM

## 2015-03-15 DIAGNOSIS — M79661 Pain in right lower leg: Secondary | ICD-10-CM | POA: Diagnosis not present

## 2015-03-15 DIAGNOSIS — R609 Edema, unspecified: Secondary | ICD-10-CM | POA: Diagnosis not present

## 2015-03-15 DIAGNOSIS — M79609 Pain in unspecified limb: Secondary | ICD-10-CM | POA: Diagnosis not present

## 2015-03-15 NOTE — Progress Notes (Signed)
   Subjective:    Patient ID: Andrea Mckay, female    DOB: 21-Feb-1946, 69 y.o.   MRN: [id number]  HPI  She has had swelling and intermittent discomfort in the right lower extremity since mid May after driving 11 hours to [place]. She states she did stop every 2 hours. She also wore compression hose. The discomfort was described as pain in the Achilles tendon area on the right.  She has had chronic intermittent swelling. 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  She's taken Edecrin which did not help the swelling. She also used ginger tea & ASA with some improvement in the tightness in the leg.  On 7/26 at 2 AM she awoke with circumferential electric type pain in the right lower extremity. The day before she had worked in the [employer] noted return of the swelling.   She is on ASA 81 mg. She is using estrogen CREAM.  She's had no associated cardiopulmonary symptoms. She also denies any bleeding dyscrasias.  She does use CPAP which is been dramatically effective in controlling irritability; sense of impending doom as well as severe fatigue.  Review of Systems Chest pain, palpitations, tachycardia, exertional dyspnea, paroxysmal nocturnal dyspnea,or claudication are absent.  Epistaxis, hemoptysis, hematuria, melena, or rectal bleeding denied. No unexplained weight loss, significant dyspepsia,dysphagia, or abdominal pain.  There is no abnormal bruising , bleeding, or difficulty stopping bleeding with injury.    Objective:   Physical Exam  Pertinent or positive findings include:  Bilateral ptosis is present.. She does have trace edema right lower extremity. Equivocal Homans sign on the right.  General appearance :adequately nourished; in no distress. BMI 31.79  Eyes: No conjunctival inflammation or scleral icterus is present.  Oral exam:  Lips and gums are healthy appearing.There is no  oropharyngeal erythema or exudate noted. Dental hygiene is good.  Heart:  Normal rate and regular rhythm. S1 and S2 normal without gallop, murmur, click, rub or other extra sounds    Lungs:Chest clear to auscultation; no wheezes, rhonchi,rales ,or rubs present.No increased work of breathing.   Abdomen: bowel sounds normal, soft and non-tender without masses, organomegaly or hernias noted.  No guarding or rebound.   Vascular : all pulses equal ; no bruits present.  Skin:Warm & dry.  Intact without suspicious lesions or rashes ; no tenting or jaundice   Lymphatic: No lymphadenopathy is noted about the head, neck, axilla   Neuro: Strength, tone & DTRs normal.        Assessment & Plan:  #1 right lower extremity edema and pain. 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  Plan: See orders recommendations. She'll be covered with Xarelto until deep venous or ecchymosis is ruled out.

## 2015-03-15 NOTE — Patient Instructions (Addendum)
  Please start Xarelto 15 mg samples twice a day until we have ruled out a clot in the leg. Do not take aspirin, Advil, Nuprin, or any other anti-inflammatory medications with this.  Lot #[id number]; expiration date 9/17 .

## 2015-03-15 NOTE — Progress Notes (Signed)
Pre visit review using our clinic review tool, if applicable. No additional management support is needed unless otherwise documented below in the visit note. 

## 2015-03-16 ENCOUNTER — Telehealth: Payer: Self-pay | Admitting: Emergency Medicine

## 2015-03-16 ENCOUNTER — Ambulatory Visit (HOSPITAL_COMMUNITY)
Admission: RE | Admit: 2015-03-16 | Discharge: 2015-03-16 | Disposition: A | Discharge status: Home / Self Care | Payer: Medicare Other | Source: Ambulatory Visit | Attending: Internal Medicine | Admitting: Internal Medicine

## 2015-03-16 DIAGNOSIS — M79606 Pain in leg, unspecified: Secondary | ICD-10-CM | POA: Diagnosis not present

## 2015-03-16 DIAGNOSIS — M79661 Pain in right lower leg: Secondary | ICD-10-CM

## 2015-03-16 DIAGNOSIS — R600 Localized edema: Secondary | ICD-10-CM | POA: Diagnosis not present

## 2015-03-16 LAB — D-DIMER, QUANTITATIVE (NOT AT ARMC): D DIMER QUANT: 0.41 ug{FEU}/mL (ref 0.00–0.48)

## 2015-03-16 NOTE — Telephone Encounter (Signed)
Spoke with Nurse from [facility] at [facility], stated pt was neg for DVT and Neg for Baker Cyst. Hopp instructed pt to stop Xarelto and to use support hose.

## 2015-03-16 NOTE — Progress Notes (Signed)
*  Preliminary Results* Right lower extremity venous duplex completed. Right lower extremity is negative for deep vein thrombosis. There is no evidence of right Baker's cyst.  Preliminary results discussed with Lovena Le or Dr. Clayborn Heron office.   03/16/2015 9:25 AM  Maudry Mayhew, RVT, RDCS, RDMS

## 2015-03-22 DIAGNOSIS — G4700 Insomnia, unspecified: Secondary | ICD-10-CM | POA: Diagnosis not present

## 2015-03-22 DIAGNOSIS — M545 Low back pain: Secondary | ICD-10-CM | POA: Diagnosis not present

## 2015-03-22 DIAGNOSIS — M7070 Other bursitis of hip, unspecified hip: Secondary | ICD-10-CM | POA: Diagnosis not present

## 2015-03-22 DIAGNOSIS — M461 Sacroiliitis, not elsewhere classified: Secondary | ICD-10-CM | POA: Diagnosis not present

## 2015-03-27 ENCOUNTER — Ambulatory Visit: Payer: Medicare Other | Admitting: Internal Medicine

## 2015-03-29 DIAGNOSIS — L298 Other pruritus: Secondary | ICD-10-CM | POA: Diagnosis not present

## 2015-03-29 DIAGNOSIS — L309 Dermatitis, unspecified: Secondary | ICD-10-CM | POA: Diagnosis not present

## 2015-03-30 ENCOUNTER — Telehealth: Payer: Self-pay | Admitting: Internal Medicine

## 2015-03-30 NOTE — Telephone Encounter (Signed)
Rec'd records from [facility] [employer] [facility]Forwarding 36pg to Dr.Kollar

## 2015-04-02 ENCOUNTER — Telehealth: Payer: Self-pay | Admitting: Internal Medicine

## 2015-04-02 DIAGNOSIS — M779 Enthesopathy, unspecified: Secondary | ICD-10-CM | POA: Diagnosis not present

## 2015-04-02 NOTE — Telephone Encounter (Signed)
Rec'd from [facility] forward 32 pages to Daniels

## 2015-04-05 ENCOUNTER — Encounter: Payer: Self-pay | Admitting: Internal Medicine

## 2015-04-05 ENCOUNTER — Ambulatory Visit (INDEPENDENT_AMBULATORY_CARE_PROVIDER_SITE_OTHER): Payer: Medicare Other | Admitting: Internal Medicine

## 2015-04-05 VITALS — BP 132/82 | HR 74 | Temp 98.5°F | Resp 14 | Ht 65.0 in | Wt 201.8 lb

## 2015-04-05 DIAGNOSIS — R635 Abnormal weight gain: Secondary | ICD-10-CM

## 2015-04-05 DIAGNOSIS — Z0000 Encounter for general adult medical examination without abnormal findings: Secondary | ICD-10-CM

## 2015-04-05 DIAGNOSIS — Z780 Asymptomatic menopausal state: Secondary | ICD-10-CM

## 2015-04-05 DIAGNOSIS — E538 Deficiency of other specified B group vitamins: Secondary | ICD-10-CM

## 2015-04-05 DIAGNOSIS — E039 Hypothyroidism, unspecified: Secondary | ICD-10-CM

## 2015-04-05 DIAGNOSIS — R7301 Impaired fasting glucose: Secondary | ICD-10-CM

## 2015-04-05 DIAGNOSIS — E559 Vitamin D deficiency, unspecified: Secondary | ICD-10-CM

## 2015-04-05 DIAGNOSIS — E785 Hyperlipidemia, unspecified: Secondary | ICD-10-CM

## 2015-04-05 NOTE — Progress Notes (Signed)
   Subjective:    Patient ID: Andrea Mckay, female    DOB: February 26, 1946, 69 y.o.   MRN: [id number]  HPI Here for medicare wellness, no new complaints. Please see A/P for status and treatment of chronic medical problems.   Diet: vegetarian Physical activity: active Depression/mood screen: negative Hearing: intact to whispered voice Visual acuity: grossly normal, performs annual eye exam  ADLs: capable Fall risk: none Home safety: good Cognitive evaluation: intact to orientation, naming, recall and repetition EOL planning: adv directives discussed and in place  I have personally reviewed and have noted 1. The patient's medical and social history - reviewed today no changes 2. Their use of alcohol, tobacco or illicit drugs 3. Their current medications and supplements 4. The patient's functional ability including ADL's, fall risks, home safety risks and hearing or visual impairment. 5. Diet and physical activities 6. Evidence for depression or mood disorders 7. Care team reviewed and updated (available in snapshot)  Review of Systems  Constitutional: Positive for fatigue and unexpected weight change. Negative for fever, activity change and appetite change.  HENT: Negative.   Eyes: Negative.   Respiratory: Negative for cough, chest tightness, shortness of breath and wheezing.   Cardiovascular: Negative for chest pain, palpitations and leg swelling.  Gastrointestinal: Negative for nausea, abdominal pain, diarrhea, constipation and abdominal distention.  Musculoskeletal: Positive for myalgias. Negative for back pain and arthralgias.  Skin: Positive for rash.  Neurological: Negative.   Psychiatric/Behavioral: Negative.       Objective:   Physical Exam  Constitutional: She is oriented to person, place, and time. She appears well-developed and well-nourished.  Overweight  HENT:  Head: Normocephalic and atraumatic.  Eyes: EOM are normal.  Neck: Normal range of motion.    Cardiovascular: Normal rate and regular rhythm.   Pulmonary/Chest: Effort normal and breath sounds normal. No respiratory distress. She has no wheezes. She has no rales.  Abdominal: Soft. Bowel sounds are normal. She exhibits no distension. There is no tenderness. There is no rebound.  Musculoskeletal: She exhibits no edema.  Neurological: She is alert and oriented to person, place, and time.  Skin: Skin is warm and dry.  Some areas of dry skin which appear slightly red (unclear if scratching).    Filed Vitals:   04/05/15 1528  BP: 132/82  Pulse: 74  Temp: 98.5 F (36.9 C)  TempSrc: Oral  Resp: 14  Height: 5\' 5"  (1.651 m)  Weight: 201 lb 12.8 oz (91.536 kg)  SpO2: 95%      Assessment & Plan:

## 2015-04-05 NOTE — Progress Notes (Signed)
Pre visit review using our clinic review tool, if applicable. No additional management support is needed unless otherwise documented below in the visit note. 

## 2015-04-05 NOTE — Assessment & Plan Note (Signed)
Ordered DEXA, talked to her about mammogram, colonoscopy up to date (due in 2022). Declines immunizations today. Talked to her about the importance of sun safety. She does have living will and advanced directives in place. 10 year screening recommendations given to her at visit.

## 2015-04-05 NOTE — Patient Instructions (Signed)
We are going to get the bone density and the labs. We will call you with the appointment for the bone density. It will be in the basement at our office.   You can come back for the labs. In order to get accurate results please come before 9AM so that we can interpret them well.   Health Maintenance Adopting a healthy lifestyle and getting preventive care can go a long way to promote health and wellness. Talk with your health care provider about what schedule of regular examinations is right for you. This is a good chance for you to check in with your provider about disease prevention and staying healthy. In between checkups, there are plenty of things you can do on your own. Experts have done a lot of research about which lifestyle changes and preventive measures are most likely to keep you healthy. Ask your health care provider for more information. WEIGHT AND DIET  Eat a healthy diet  Be sure to include plenty of vegetables, fruits, low-fat dairy products, and lean protein.  Do not eat a lot of foods high in solid fats, added sugars, or salt.  Get regular exercise. This is one of the most important things you can do for your health.  Most adults should exercise for at least 150 minutes each week. The exercise should increase your heart rate and make you sweat (moderate-intensity exercise).  Most adults should also do strengthening exercises at least twice a week. This is in addition to the moderate-intensity exercise.  Maintain a healthy weight  Body mass index (BMI) is a measurement that can be used to identify possible weight problems. It estimates body fat based on height and weight. Your health care provider can help determine your BMI and help you achieve or maintain a healthy weight.  For females 34 years of age and older:   A BMI below 18.5 is considered underweight.  A BMI of 18.5 to 24.9 is normal.  A BMI of 25 to 29.9 is considered overweight.  A BMI of 30 and above is  considered obese.  Watch levels of cholesterol and blood lipids  You should start having your blood tested for lipids and cholesterol at 70 years of age, then have this test every 5 years.  You may need to have your cholesterol levels checked more often if:  Your lipid or cholesterol levels are high.  You are older than 70 years of age.  You are at high risk for heart disease.  CANCER SCREENING   Lung Cancer  Lung cancer screening is recommended for adults 86-7 years old who are at high risk for lung cancer because of a history of smoking.  A yearly low-dose CT scan of the lungs is recommended for people who:  Currently smoke.  Have quit within the past 15 years.  Have at least a 30-pack-year history of smoking. A pack year is smoking an average of one pack of cigarettes a day for 1 year.  Yearly screening should continue until it has been 15 years since you quit.  Yearly screening should stop if you develop a health problem that would prevent you from having lung cancer treatment.  Breast Cancer  Practice breast self-awareness. This means understanding how your breasts normally appear and feel.  It also means doing regular breast self-exams. Let your health care provider know about any changes, no matter how small.  If you are in your 20s or 30s, you should have a clinical breast exam (CBE)  by a health care provider every 1-3 years as part of a regular health exam.  If you are 40 or older, have a CBE every year. Also consider having a breast X-ray (mammogram) every year.  If you have a family history of breast cancer, talk to your health care provider about genetic screening.  If you are at high risk for breast cancer, talk to your health care provider about having an MRI and a mammogram every year.  Breast cancer gene (BRCA) assessment is recommended for women who have family members with BRCA-related cancers. BRCA-related cancers  include:  Breast.  Ovarian.  Tubal.  Peritoneal cancers.  Results of the assessment will determine the need for genetic counseling and BRCA1 and BRCA2 testing. Cervical Cancer Routine pelvic examinations to screen for cervical cancer are no longer recommended for nonpregnant women who are considered low risk for cancer of the pelvic organs (ovaries, uterus, and vagina) and who do not have symptoms. A pelvic examination may be necessary if you have symptoms including those associated with pelvic infections. Ask your health care provider if a screening pelvic exam is right for you.   The Pap test is the screening test for cervical cancer for women who are considered at risk.  If you had a hysterectomy for a problem that was not cancer or a condition that could lead to cancer, then you no longer need Pap tests.  If you are older than 65 years, and you have had normal Pap tests for the past 10 years, you no longer need to have Pap tests.  If you have had past treatment for cervical cancer or a condition that could lead to cancer, you need Pap tests and screening for cancer for at least 20 years after your treatment.  If you no longer get a Pap test, assess your risk factors if they change (such as having a new sexual partner). This can affect whether you should start being screened again.  Some women have medical problems that increase their chance of getting cervical cancer. If this is the case for you, your health care provider may recommend more frequent screening and Pap tests.  The human papillomavirus (HPV) test is another test that may be used for cervical cancer screening. The HPV test looks for the virus that can cause cell changes in the cervix. The cells collected during the Pap test can be tested for HPV.  The HPV test can be used to screen women 30 years of age and older. Getting tested for HPV can extend the interval between normal Pap tests from three to five years.  An HPV  test also should be used to screen women of any age who have unclear Pap test results.  After 70 years of age, women should have HPV testing as often as Pap tests.  Colorectal Cancer  This type of cancer can be detected and often prevented.  Routine colorectal cancer screening usually begins at 70 years of age and continues through 70 years of age.  Your health care provider may recommend screening at an earlier age if you have risk factors for colon cancer.  Your health care provider may also recommend using home test kits to check for hidden blood in the stool.  A small camera at the end of a tube can be used to examine your colon directly (sigmoidoscopy or colonoscopy). This is done to check for the earliest forms of colorectal cancer.  Routine screening usually begins at age 50.  Direct   examination of the colon should be repeated every 5-10 years through 70 years of age. However, you may need to be screened more often if early forms of precancerous polyps or small growths are found. Skin Cancer  Check your skin from head to toe regularly.  Tell your health care provider about any new moles or changes in moles, especially if there is a change in a mole's shape or color.  Also tell your health care provider if you have a mole that is larger than the size of a pencil eraser.  Always use sunscreen. Apply sunscreen liberally and repeatedly throughout the day.  Protect yourself by wearing long sleeves, pants, a wide-brimmed hat, and sunglasses whenever you are outside. HEART DISEASE, DIABETES, AND HIGH BLOOD PRESSURE   Have your blood pressure checked at least every 1-2 years. High blood pressure causes heart disease and increases the risk of stroke.  If you are between 55 years and 79 years old, ask your health care provider if you should take aspirin to prevent strokes.  Have regular diabetes screenings. This involves taking a blood sample to check your fasting blood sugar  level.  If you are at a normal weight and have a low risk for diabetes, have this test once every three years after 70 years of age.  If you are overweight and have a high risk for diabetes, consider being tested at a younger age or more often. PREVENTING INFECTION  Hepatitis B  If you have a higher risk for hepatitis B, you should be screened for this virus. You are considered at high risk for hepatitis B if:  You were born in a country where hepatitis B is common. Ask your health care provider which countries are considered high risk.  Your parents were born in a high-risk country, and you have not been immunized against hepatitis B (hepatitis B vaccine).  You have HIV or AIDS.  You use needles to inject street drugs.  You live with someone who has hepatitis B.  You have had sex with someone who has hepatitis B.  You get hemodialysis treatment.  You take certain medicines for conditions, including cancer, organ transplantation, and autoimmune conditions. Hepatitis C  Blood testing is recommended for:  Everyone born from 1945 through 1965.  Anyone with known risk factors for hepatitis C. Sexually transmitted infections (STIs)  You should be screened for sexually transmitted infections (STIs) including gonorrhea and chlamydia if:  You are sexually active and are younger than 70 years of age.  You are older than 70 years of age and your health care provider tells you that you are at risk for this type of infection.  Your sexual activity has changed since you were last screened and you are at an increased risk for chlamydia or gonorrhea. Ask your health care provider if you are at risk.  If you do not have HIV, but are at risk, it may be recommended that you take a prescription medicine daily to prevent HIV infection. This is called pre-exposure prophylaxis (PrEP). You are considered at risk if:  You are sexually active and do not regularly use condoms or know the HIV status  of your partner(s).  You take drugs by injection.  You are sexually active with a partner who has HIV. Talk with your health care provider about whether you are at high risk of being infected with HIV. If you choose to begin PrEP, you should first be tested for HIV. You should then be tested   every 3 months for as long as you are taking PrEP.  PREGNANCY   If you are premenopausal and you may become pregnant, ask your health care provider about preconception counseling.  If you may become pregnant, take 400 to 800 micrograms (mcg) of folic acid every day.  If you want to prevent pregnancy, talk to your health care provider about birth control (contraception). OSTEOPOROSIS AND MENOPAUSE   Osteoporosis is a disease in which the bones lose minerals and strength with aging. This can result in serious bone fractures. Your risk for osteoporosis can be identified using a bone density scan.  If you are 65 years of age or older, or if you are at risk for osteoporosis and fractures, ask your health care provider if you should be screened.  Ask your health care provider whether you should take a calcium or vitamin D supplement to lower your risk for osteoporosis.  Menopause may have certain physical symptoms and risks.  Hormone replacement therapy may reduce some of these symptoms and risks. Talk to your health care provider about whether hormone replacement therapy is right for you.  HOME CARE INSTRUCTIONS   Schedule regular health, dental, and eye exams.  Stay current with your immunizations.   Do not use any tobacco products including cigarettes, chewing tobacco, or electronic cigarettes.  If you are pregnant, do not drink alcohol.  If you are breastfeeding, limit how much and how often you drink alcohol.  Limit alcohol intake to no more than 1 drink per day for nonpregnant women. One drink equals 12 ounces of beer, 5 ounces of wine, or 1 ounces of hard liquor.  Do not use street  drugs.  Do not share needles.  Ask your health care provider for help if you need support or information about quitting drugs.  Tell your health care provider if you often feel depressed.  Tell your health care provider if you have ever been abused or do not feel safe at home. Document Released: 02/17/2011 Document Revised: 12/19/2013 Document Reviewed: 07/06/2013 Exit[employer] Patient Information 2015 [employer]. This information is not intended to replace advice given to you by your health care provider. Make sure you discuss any questions you have with your health care provider.  

## 2015-04-06 ENCOUNTER — Ambulatory Visit (INDEPENDENT_AMBULATORY_CARE_PROVIDER_SITE_OTHER)
Admission: RE | Admit: 2015-04-06 | Discharge: 2015-04-06 | Disposition: A | Discharge status: Home / Self Care | Payer: Medicare Other | Source: Ambulatory Visit | Attending: Internal Medicine | Admitting: Internal Medicine

## 2015-04-06 ENCOUNTER — Encounter: Payer: Self-pay | Admitting: Internal Medicine

## 2015-04-06 DIAGNOSIS — Z780 Asymptomatic menopausal state: Secondary | ICD-10-CM | POA: Diagnosis not present

## 2015-04-06 NOTE — Telephone Encounter (Signed)
Can we get her scheduled for a nurse visit for EKG for z00.00, thanks. Dr. Doug Sou

## 2015-04-07 ENCOUNTER — Encounter: Payer: Self-pay | Admitting: Internal Medicine

## 2015-04-09 ENCOUNTER — Encounter: Payer: Self-pay | Admitting: Internal Medicine

## 2015-04-09 ENCOUNTER — Ambulatory Visit (INDEPENDENT_AMBULATORY_CARE_PROVIDER_SITE_OTHER): Payer: Medicare Other

## 2015-04-09 DIAGNOSIS — Z0000 Encounter for general adult medical examination without abnormal findings: Secondary | ICD-10-CM | POA: Diagnosis not present

## 2015-04-11 ENCOUNTER — Other Ambulatory Visit (INDEPENDENT_AMBULATORY_CARE_PROVIDER_SITE_OTHER): Payer: Medicare Other

## 2015-04-11 DIAGNOSIS — E785 Hyperlipidemia, unspecified: Secondary | ICD-10-CM | POA: Diagnosis not present

## 2015-04-11 DIAGNOSIS — R7301 Impaired fasting glucose: Secondary | ICD-10-CM

## 2015-04-11 DIAGNOSIS — E039 Hypothyroidism, unspecified: Secondary | ICD-10-CM

## 2015-04-11 DIAGNOSIS — R635 Abnormal weight gain: Secondary | ICD-10-CM | POA: Diagnosis not present

## 2015-04-11 DIAGNOSIS — E538 Deficiency of other specified B group vitamins: Secondary | ICD-10-CM | POA: Diagnosis not present

## 2015-04-11 DIAGNOSIS — E559 Vitamin D deficiency, unspecified: Secondary | ICD-10-CM

## 2015-04-11 LAB — COMPREHENSIVE METABOLIC PANEL
ALT: 58 U/L — ABNORMAL HIGH (ref 0–35)
AST: 28 U/L (ref 0–37)
Albumin: 3.8 g/dL (ref 3.5–5.2)
Alkaline Phosphatase: 108 U/L (ref 39–117)
BUN: 12 mg/dL (ref 6–23)
CHLORIDE: 105 meq/L (ref 96–112)
CO2: 28 meq/L (ref 19–32)
Calcium: 9.2 mg/dL (ref 8.4–10.5)
Creatinine, Ser: 0.7 mg/dL (ref 0.40–1.20)
GFR: 87.95 mL/min (ref >60.00)
GLUCOSE: 111 mg/dL — AB (ref 70–99)
POTASSIUM: 4.3 meq/L (ref 3.5–5.1)
Sodium: 141 mEq/L (ref 135–145)
Total Bilirubin: 0.4 mg/dL (ref 0.2–1.2)
Total Protein: 6.7 g/dL (ref 6.0–8.3)

## 2015-04-11 LAB — VITAMIN B12: Vitamin B-12: 510 pg/mL (ref 211–911)

## 2015-04-11 LAB — LIPID PANEL
CHOL/HDL RATIO: 4
Cholesterol: 229 mg/dL — ABNORMAL HIGH (ref 0–200)
HDL: 59 mg/dL (ref >39.00)
LDL CALC: 146 mg/dL — AB (ref 0–99)
NONHDL: 170.13
TRIGLYCERIDES: 120 mg/dL (ref 0.0–149.0)
VLDL: 24 mg/dL (ref 0.0–40.0)

## 2015-04-11 LAB — HEMOGLOBIN A1C: HEMOGLOBIN A1C: 6.5 % (ref 4.6–6.5)

## 2015-04-11 LAB — CORTISOL: Cortisol, Plasma: 17.6 ug/dL

## 2015-04-11 LAB — TSH: TSH: 5.09 u[IU]/mL — AB (ref 0.35–4.50)

## 2015-04-11 LAB — C-REACTIVE PROTEIN: CRP: 0.4 mg/dL — ABNORMAL LOW (ref 0.5–20.0)

## 2015-04-11 LAB — T4, FREE: FREE T4: 0.55 ng/dL — AB (ref 0.60–1.60)

## 2015-04-11 LAB — T3, FREE: T3, Free: 2.7 pg/mL (ref 2.3–4.2)

## 2015-04-11 LAB — VITAMIN D 25 HYDROXY (VIT D DEFICIENCY, FRACTURES): VITD: 29.99 ng/mL — AB (ref 30.00–100.00)

## 2015-04-15 LAB — DHEA: DHEA: 336 ng/dL (ref 102–1185)

## 2015-04-17 ENCOUNTER — Encounter: Payer: Self-pay | Admitting: Internal Medicine

## 2015-04-17 ENCOUNTER — Other Ambulatory Visit (INDEPENDENT_AMBULATORY_CARE_PROVIDER_SITE_OTHER): Payer: Medicare Other

## 2015-04-17 ENCOUNTER — Ambulatory Visit (INDEPENDENT_AMBULATORY_CARE_PROVIDER_SITE_OTHER): Payer: Medicare Other | Admitting: Internal Medicine

## 2015-04-17 VITALS — BP 122/82 | HR 74 | Temp 98.1°F | Resp 16 | Wt 205.0 lb

## 2015-04-17 DIAGNOSIS — M797 Fibromyalgia: Secondary | ICD-10-CM | POA: Diagnosis not present

## 2015-04-17 DIAGNOSIS — E559 Vitamin D deficiency, unspecified: Secondary | ICD-10-CM | POA: Diagnosis not present

## 2015-04-17 DIAGNOSIS — M79661 Pain in right lower leg: Secondary | ICD-10-CM

## 2015-04-17 DIAGNOSIS — R609 Edema, unspecified: Secondary | ICD-10-CM | POA: Insufficient documentation

## 2015-04-17 LAB — CK: Total CK: 67 U/L (ref 7–177)

## 2015-04-17 LAB — MAGNESIUM: Magnesium: 2.3 mg/dL (ref 1.5–2.5)

## 2015-04-17 MED ORDER — THYROID 146.25 MG PO TABS: 146.2500 mg | ORAL_TABLET | Freq: Every day | ORAL | Status: DC

## 2015-04-17 NOTE — Patient Instructions (Signed)
  Your next office appointment will be determined based upon review of your pending labs   Those instructions will be transmitted to you through My Chart   Critical values will be called.  Followup as needed for any active or acute issue. Please report any significant change in your symptoms. 

## 2015-04-17 NOTE — Progress Notes (Signed)
   Subjective:    Patient ID: Andrea Mckay, female    DOB: [birth year], 69 y.o.   MRN: [id number]  HPI  She describes intermittent electric, stabbing pain lasting 1-2 seconds from the inferior gastrocnemius to the posterior ankle. It's random and not associated with exercise. In fact she walks 10-15 miles per week with no exacerbation of symptoms. She describes it as if "my bones breaking". The pain can be as high as a "10 out of 10".  She questions some puffiness in the anterior inferior shin but the edema for which she was seen in July has resolved otherwise. At that time she had edema in the context of prolonged driving. Venous topper was negative for deep venous thrombosis. 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 She also has a PMH of fibromyalgia.   She has no associated cardiopulmonary symptoms.  This month she had extensive labs. One liver function test was was mildly elevated. Her calcium and potassium were normal. Her last vitamin D was 29.9 on 04/11/15.  She takes vitamin D 1000-2000 international units as well as magnesium but she takes these on an irregular basis.  She recently saw her Endocrinologist and her thyroid dose was adjusted.   Review of Systems  There is no significant cough, sputum production,hemoptysis, or wheezing.  Chest pain, palpitations, tachycardia, exertional dyspnea, paroxysmal nocturnal dyspnea, claudication or significant edema are absent.      Objective:   Physical Exam Pertinent or positive findings include: No The right calf is 38 cm in circumference 18 cm below the inferior patellar margin and the right 37 cm. Homans sign is negative. There is no tenderness of the Achilles tendon. She can walk on her toes and heels without symptoms. There is minimal palpable change in the right inferior shin area anteriorly suggesting possible varicosity.There is some lipomatous changes of the right  lateral malleolus.   General appearance :adequately nourished; in no distress.  Eyes: No conjunctival inflammation or scleral icterus is present.  Heart:  Normal rate and regular rhythm. S1 and S2 normal without gallop, murmur, click, rub or other extra sounds    Lungs:Chest clear to auscultation; no wheezes, rhonchi,rales ,or rubs present.No increased work of breathing.   Abdomen: bowel sounds normal, soft and non-tender without masses, organomegaly or hernias noted.  No guarding or rebound.   Vascular : all pulses equal ; no bruits present.  Skin:Warm & dry.  Intact without suspicious lesions or rashes ; no tenting or jaundice   Lymphatic: No lymphadenopathy is noted about the head, neck, axilla.   Neuro: Strength, tone & DTRs normal.         Assessment & Plan:  #1 deep calf pain which is intermittent and transitory #2 vit D deficiency  See orders

## 2015-04-17 NOTE — Progress Notes (Signed)
Pre visit review using our clinic review tool, if applicable. No additional management support is needed unless otherwise documented below in the visit note. 

## 2015-04-19 ENCOUNTER — Ambulatory Visit: Payer: Medicare Other | Attending: Internal Medicine | Admitting: Physical Therapy

## 2015-04-19 DIAGNOSIS — M7071 Other bursitis of hip, right hip: Secondary | ICD-10-CM | POA: Diagnosis not present

## 2015-04-19 DIAGNOSIS — M7072 Other bursitis of hip, left hip: Secondary | ICD-10-CM | POA: Diagnosis not present

## 2015-04-19 LAB — LEPTIN: Leptin, Serum: 10.1 ng/mL

## 2015-04-19 NOTE — Therapy (Signed)
[place], [place], [address] Phone: [phone]   Fax:  [phone]  Physical Therapy Evaluation  Patient Details  Name: Andrea Mckay MRN: [id number] Date of Birth: 10-10-1945 Referring Provider:  Olga Millers, MD  Encounter Date: 04/19/2015      PT End of Session - 04/19/15 1456    Visit Number 1   Number of Visits 12   Date for PT Re-Evaluation 05/31/15   PT Start Time 1005   PT Stop Time 1050   PT Time Calculation (min) 45 min   Activity Tolerance Patient tolerated treatment well   Behavior During Therapy [facility] for tasks assessed/performed      Past Medical History  Diagnosis Date  . Myalgia and myositis, unspecified   . Cancer 01/2006    melanoma  . Thyroid disease     hypothyroid  . Postmenopausal   . Goiter   . Fibromyalgia   . Kidney stone   . Allergy   . Arthritis     osteoarthritis  . Diabetes mellitus     diet controlled  . Hyperlipidemia   . Osteoporosis     osteopenia  . Heart murmur   . Foreign body     foot  . Diarrhea   . Candidiasis     vagina/vulva  . Tick bite   . Malignant melanoma   . S/P colonoscopy   . Coronary artery disease     Past Surgical History  Procedure Laterality Date  . Thyroidectomy  1982  . Abdominal hysterectomy  1987  . Knee arthroscopy w/ acl reconstruction    . Foot tendon surgery  1982    Left  . Ovarian cyst removal  1999    removed b/l   . Lipoma excision  01/20/2011    There were no vitals filed for this visit.  Visit Diagnosis:  Hip bursitis, right  Bursitis of hip, left      Subjective Assessment - 04/19/15 1012    Subjective Pt is here today for bursitis in her hips. Pt states this has occured over the past few years R>L however the left just started this week getting worse 2 days ago .  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  Pt is a Tourist information centre manager and she went to a music festival about a month ago and  stood for an hour which resulted in her right leg becoming very painful. " I am very active I workout doing yoga, taichi, swimming and other things all of my life".   Pertinent History Bursitis bilateral hips R>L, adhesive capsulitis L shoulder, fibromyalgia, valvular dysfunction in right LE, R sided calf pain and swelling, osteoporosis   Limitations Standing;Other (comment)  laying on her side   How long can you sit comfortably? not much problem   How long can you stand comfortably?  one hour at least    How long can you walk comfortably? no problem can walk 41mi without problem   Diagnostic tests X ray on L foot couple weeks ago, mRI in the past, bone density scan 04/06/15   Patient Stated Goals pain relief   Currently in Pain? No/denies  when in pain at worse 8/10 relieved with stretching   Pain Location Hip   Pain Orientation Right;Left   Pain Descriptors / Indicators Constant   Pain Type Chronic pain   Pain Radiating Towards no radiation   Pain Onset More than a month ago   Pain  Frequency Constant   Aggravating Factors  standing   Pain Relieving Factors yoga stretching, heat   Effect of Pain on Daily Activities no affect on ADLs   Multiple Pain Sites No            [facility] PT Assessment - 04/19/15 1020    Assessment   Medical Diagnosis Bursitis, SI Joint   Onset Date/Surgical Date 04/17/15   Hand Dominance Right   Prior Therapy yes   Precautions   Precautions None   Restrictions   Weight Bearing Restrictions No   Balance Screen   Has the patient fallen in the past 6 months No   Home Environment   Living Environment Assisted living   Prior Function   Level of Independence Independent   Cognition   Overall Cognitive Status Within Functional Limits for tasks assessed   Sensation   Light Touch Not tested   Coordination   Gross Motor Movements are Fluid and Coordinated Not tested   Posture/Postural Control   Posture/Postural Control No significant limitations   ROM /  Strength   AROM / PROM / Strength AROM;Strength   AROM   Overall AROM  Deficits   Overall AROM Comments pt unable to initally lift her Rt. leg in prone into hip extension felt like "i really had to focus to get it to move"   AROM Assessment Site Hip;Knee   Right/Left Hip --  All WFL except Rt. LE hip extension 8degrees (Lt. 10deg)   Right/Left Knee --  [facility]   Strength   Overall Strength Deficits   Strength Assessment Site Hip;Knee;Ankle   Right/Left Hip Right;Left   Right Hip Flexion 4+/5   Right Hip Extension 4/5  difficulty intially getting the leg up   Right Hip ABduction 5/5   Right Hip ADduction 5/5   Left Hip Flexion 4+/5   Left Hip Extension 5/5   Left Hip ABduction 5/5   Left Hip ADduction 5/5   Right/Left Knee --  5/5 bilaterally   Right/Left Ankle --  bilateral DF 5/5   Flexibility   Soft Tissue Assessment /Muscle Length yes   Hamstrings Both Hamstrings WFL, pt had some pain in Rt. hip at 90 degrees of flexion, no pain with AROM to 90 degrees of hip flexion   Palpation   Palpation comment tenderness in hip joint when performing SI compression test bilaterally   Special Tests    Special Tests Sacrolliac Tests   Sacroiliac Tests  Sacral Thrust   Sacral thrust    Findings Negative   Sacral Compression   Findings Negative   RLE Strength   Right Hip External Rotation  5   Right Hip Internal Rotation  5   LLE Strength   Left Hip External Rotation  5   Left Hip Internal Rotation  5     Reflexes bilateral LE 2+        [facility] Adult PT Treatment/Exercise - 04/19/15 1020    Knee/Hip Exercises: Stretches   Hip Flexor Stretch Both;3 reps;20 seconds   Hip Flexor Stretch Limitations half kneeling   ITB Stretch Both;2 reps;20 seconds   ITB Stretch Limitations standing             PT Education - 04/19/15 1456    Education provided Yes   Education Details Dx, POC/PT, HEP   Person(s) Educated Patient   Methods Explanation;Handout;Demonstration    Comprehension Verbalized understanding;Returned demonstration          PT Short Term Goals - 04/19/15  [place] #1   Title Pt. will be I with initial HEP   Time 2   Period Weeks   Status New   PT SHORT TERM GOAL #2   Title Pt. will demonstrate and verbalize condition management sich as RICE and positioning   Time 2   Period Weeks   Status New           PT Long Term Goals - 04/19/15 1655    PT LONG TERM GOAL #1   Title Pt. will be I with advanced HEP   Time 6   Period Weeks   Status New   PT LONG TERM GOAL #2   Title Pt. will demo improved ability to  lift rt. leg in prone (hip extension) with ease    Baseline has to think about it, force it to move   Time 6   Period Weeks   Status New   PT LONG TERM GOAL #3   Title Pt. will report decreased hip pain from 8/10 to 3/10 in order to lay on her side in bed to sleep   Time 6   Period Weeks   Status New   PT LONG TERM GOAL #4   Title Pt will report improved ability to stand for one hour without increasing hip pain most of the time   Time 6   Period Weeks   Status New   PT LONG TERM GOAL #5   Title --             G-Codes - 29-Apr-2015 0859    Functional Assessment Tool Used --   Functional Limitation Changing and maintaining body position   Changing and Maintaining Body Position Current Status (P7106) At least 20 percent but less than 40 percent impaired, limited or restricted   Changing and Maintaining Body Position Goal Status (Y6948) At least 1 percent but less than 20 percent impaired, limited or restricted            Plan - 04/19/15 1457    Clinical Impression Statement Pt. is a 69 year old physically active female that c/o bilateral hip pain for years, however experiencing increasing pain this week. She presents with minimal weakness into hip flexion, difficulty lifting rt. leg into hip extension in prone, increased pain with standing long periods, inability to lay on either side in bed  without increased hip pain, increased tightness of hip flexors and abductors. Pt. would benefit from skilled PT services to address current impairments , education on stretching  with proper form, and pain management strategies.   Pt will benefit from skilled therapeutic intervention in order to improve on the following deficits Decreased range of motion;Pain;Impaired flexibility;Decreased strength   Rehab Potential Good   PT Frequency 2x / week   PT Duration 6 weeks   PT Treatment/Interventions Ultrasound;Cryotherapy;Electrical Stimulation;Iontophoresis 4mg /ml Dexamethasone;Moist Heat;Therapeutic exercise;Therapeutic activities;Manual techniques;Patient/family education   PT Next Visit Plan stretching for hip flexors, IT band, ultrasound or estim bilateral hips   PT Home Exercise Plan half kneeling hip flexor stretch, standing IT band stretch   Consulted and Agree with Plan of Care Patient          G-Codes - 04/29/15 0859    Functional Assessment Tool Used --   Functional Limitation Changing and maintaining body position   Changing and Maintaining Body Position Current Status (N4627) At least 20 percent but less than 40 percent impaired, limited or restricted   Changing and Maintaining Body  Position Goal Status (H1834) At least 1 percent but less than 20 percent impaired, limited or restricted       Problem List Patient Active Problem List   Diagnosis Date Noted  . Edema 04/17/2015  . Vitamin D deficiency 04/17/2015  . Routine general medical examination at a health care facility 09/27/2014  . Obesity (BMI 30.0-34.9) 09/27/2014  . OSA (obstructive sleep apnea) 09/02/2014  . 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 Hyperlipidemia 04/28/2011  . Insomnia 02/09/2008  . Hypothyroidism 12/15/2006  . Fibromyalgia 12/15/2006  . RENAL CALCULUS, HX OF 12/15/2006   Radonna Ricker, SPT PAA,JENNIFER 04/20/2015, 9:01 AM  [facility] [address] [place], [place], [address] Phone: [phone]   Fax:  [phone]   Raeford Razor, PT 04/20/2015 9:03 AM Phone: [phone] Fax: [phone]

## 2015-04-20 ENCOUNTER — Other Ambulatory Visit: Payer: Self-pay | Admitting: Internal Medicine

## 2015-04-26 ENCOUNTER — Ambulatory Visit: Payer: Medicare Other | Admitting: Physical Therapy

## 2015-04-26 DIAGNOSIS — M7071 Other bursitis of hip, right hip: Secondary | ICD-10-CM | POA: Diagnosis not present

## 2015-04-26 DIAGNOSIS — M7072 Other bursitis of hip, left hip: Secondary | ICD-10-CM

## 2015-04-26 NOTE — Therapy (Signed)
[place], [place], [address] Phone: [phone]   Fax:  [phone]  Physical Therapy Treatment  Patient Details  Name: Andrea Mckay MRN: [id number] Date of Birth: 1946/06/08 Referring Provider:  Olga Millers, MD  Encounter Date: 04/26/2015      PT End of Session - 04/26/15 1208    Visit Number 2   Number of Visits 12   Date for PT Re-Evaluation 05/31/15   PT Start Time 4967   PT Stop Time 1145   PT Time Calculation (min) 40 min   Activity Tolerance Patient tolerated treatment well;No increased pain   Behavior During Therapy [facility] for tasks assessed/performed      Past Medical History  Diagnosis Date  . Myalgia and myositis, unspecified   . Cancer 01/2006    melanoma  . Thyroid disease     hypothyroid  . Postmenopausal   . Goiter   . Fibromyalgia   . Kidney stone   . Allergy   . Arthritis     osteoarthritis  . Diabetes mellitus     diet controlled  . Hyperlipidemia   . Osteoporosis     osteopenia  . Heart murmur   . Foreign body     foot  . Diarrhea   . Candidiasis     vagina/vulva  . Tick bite   . Malignant melanoma   . S/P colonoscopy   . Coronary artery disease     Past Surgical History  Procedure Laterality Date  . Thyroidectomy  1982  . Abdominal hysterectomy  1987  . Knee arthroscopy w/ acl reconstruction    . Foot tendon surgery  1982    Left  . Ovarian cyst removal  1999    removed b/l   . Lipoma excision  01/20/2011    There were no vitals filed for this visit.  Visit Diagnosis:  Bursitis of hip, left      Subjective Assessment - 04/26/15 1156    Subjective Both hips hurt intermittantly.  Today Lt worse.  Why is my left leg heavy to lift?   Currently in Pain? Yes   Pain Score --  mild to moderate   Pain Location Hip   Pain Orientation Right;Left   Pain Descriptors / Indicators Heaviness;Sore   Pain Type Chronic pain   Pain Frequency Intermittent    Aggravating Factors  swimming, lting on her sides.   Pain Relieving Factors heating pad,   Multiple Pain Sites --  has fibromyalgia all over, no pain number given.                         [facility] Adult PT Treatment/Exercise - 04/26/15 1105    Self-Care   Self-Care --  let pain be your guide, stretch daily, heat 20 minutes   Knee/Hip Exercises: Stretches   ITB Stretch Left;1 rep;30 seconds   ITB Stretch Limitations sidelying   Ultrasound   Ultrasound Location Hip LT   Ultrasound Parameters 50% 1.5 watts/cm2  8 minutes.   Ultrasound Goals Pain   Manual Therapy   Manual therapy comments soft tissur work lateral hip gluteal and thigh.   multiple sensitive areas initially softened.  Patch of tape trial to see if she is allergic.                  PT Education - 04/26/15 1206    Education provided Yes   Education Details self care and  stretch   Person(s) Educated Patient   Methods Explanation;Demonstration;Handout   Comprehension Verbalized understanding;Returned demonstration          PT Short Term Goals - 04/26/15 1211    PT SHORT TERM GOAL #1   Title Pt. will be I with initial HEP   Baseline cues   Time 2   Period Weeks   Status On-going   PT SHORT TERM GOAL #2   Title Pt. will demonstrate and verbalize condition management sich as RICE and positioning   Time 2   Period Weeks   Status On-going           PT Long Term Goals - 04/19/15 1655    PT LONG TERM GOAL #1   Title Pt. will be I with advanced HEP   Time 6   Period Weeks   Status New   PT LONG TERM GOAL #2   Title Pt. will demo improved ability to  lift rt. leg in prone (hip extension) with ease    Baseline has to think about it, force it to move   Time 6   Period Weeks   Status New   PT LONG TERM GOAL #3   Title Pt. will report decreased hip pain from 8/10 to 3/10 in order to lay on her side in bed to sleep   Time 6   Period Weeks   Status New   PT LONG TERM GOAL #4    Title Pt will report improved ability to stand for one hour without increasing hip pain most of the time   Time 6   Period Weeks   Status New   PT LONG TERM GOAL #5   Title --               Plan - 04/26/15 1208    Clinical Impression Statement Less pain post session.  Trial of tape to see if skin is allergic.  Stretching.  Progress toward pain and exercise goals.   PT Next Visit Plan stretching for hip flexors, IT band, ultrasound or estim bilateral hips  check skin under tape.    Consulted and Agree with Plan of Care Patient        Problem List Patient Active Problem List   Diagnosis Date Noted  . Edema 04/17/2015  . Vitamin D deficiency 04/17/2015  . Routine general medical examination at a health care facility 09/27/2014  . Obesity (BMI 30.0-34.9) 09/27/2014  . OSA (obstructive sleep apnea) 09/02/2014  . 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 Hyperlipidemia 04/28/2011  . Insomnia 02/09/2008  . Hypothyroidism 12/15/2006  . Fibromyalgia 12/15/2006  . RENAL CALCULUS, HX OF 12/15/2006    HARRIS,KAREN 04/26/2015, 12:14 PM  [facility] [address] [place], [place], [address] Phone: [phone]   Fax:  [phone]     Melvenia Needles, PTA 04/26/2015 12:14 PM Phone: [phone] Fax: [phone]

## 2015-04-26 NOTE — Patient Instructions (Signed)
Hip IT band issued from drawer.  Sidelying,  1 X a day, 3 reps, 30 second holds.

## 2015-05-01 ENCOUNTER — Ambulatory Visit: Payer: Medicare Other | Admitting: Physical Therapy

## 2015-05-01 DIAGNOSIS — M7071 Other bursitis of hip, right hip: Secondary | ICD-10-CM

## 2015-05-01 DIAGNOSIS — M7072 Other bursitis of hip, left hip: Secondary | ICD-10-CM

## 2015-05-01 NOTE — Therapy (Signed)
[place], [place], [address] Phone: [phone]   Fax:  [phone]  Physical Therapy Treatment  Patient Details  Name: Andrea Mckay MRN: [id number] Date of Birth: [birth year] Referring Provider:  Olga Millers, MD  Encounter Date: 05/01/2015      PT End of Session - 05/01/15 1008    Visit Number 3   Date for PT Re-Evaluation 05/31/15   PT Start Time 0804   PT Stop Time 1583   PT Time Calculation (min) 51 min   Activity Tolerance Patient tolerated treatment well;No increased pain   Behavior During Therapy [facility] for tasks assessed/performed      Past Medical History  Diagnosis Date  . Myalgia and myositis, unspecified   . Cancer 01/2006    melanoma  . Thyroid disease     hypothyroid  . Postmenopausal   . Goiter   . Fibromyalgia   . Kidney stone   . Allergy   . Arthritis     osteoarthritis  . Diabetes mellitus     diet controlled  . Hyperlipidemia   . Osteoporosis     osteopenia  . Heart murmur   . Foreign body     foot  . Diarrhea   . Candidiasis     vagina/vulva  . Tick bite   . Malignant melanoma   . S/P colonoscopy   . Coronary artery disease     Past Surgical History  Procedure Laterality Date  . Thyroidectomy  1982  . Abdominal hysterectomy  1987  . Knee arthroscopy w/ acl reconstruction    . Foot tendon surgery  1982    Left  . Ovarian cyst removal  1999    removed b/l   . Lipoma excision  01/20/2011    There were no vitals filed for this visit.  Visit Diagnosis:  Bursitis of hip, left  Hip bursitis, right      Subjective Assessment - 05/01/15 1002    Subjective Did OK after last session.  Not much pain right now gets up to 6/10.  Able to use elliptical 9 minutes.  the stretched 3 times a day  and I woke up without pain.     Currently in Pain? No/denies   Pain Location Hip   Pain Orientation Right;Left   Pain Descriptors / Indicators --  more than a  bruise(with soft tissue)   Pain Frequency Intermittent   Aggravating Factors  sleping sidelying   Pain Relieving Factors stretching                         [facility] Adult PT Treatment/Exercise - 05/01/15 0805    Knee/Hip Exercises: Stretches   ITB Stretch Both;2 reps;30 seconds   ITB Stretch Limitations sidelying with manual strumming   Ultrasound   Ultrasound Location Hip LT/RT,    Ultrasound Parameters same as previous, 8 minutes each   Ultrasound Goals Pain   Manual Therapy   Manual therapy comments soft tissue work as previous today included both hips, much less tender LT than previous. Both hip tissues felt congested and were able to be softened,  Strumming IT bands post with stretches for lengthening                   PT Short Term Goals - 05/01/15 1135    PT SHORT TERM GOAL #1   Title Pt. will be I with initial HEP   Baseline says she  is independent   Time 2   Period Weeks   Status On-going   PT SHORT TERM GOAL #2   Title Pt. will demonstrate and verbalize condition management sich as RICE and positioning   Baseline understands positioning   Time 2   Period Weeks   Status Partially Met           PT Long Term Goals - 05/01/15 1135    PT LONG TERM GOAL #1   Title Pt. will be I with advanced HEP   Time 6   Period Weeks   Status On-going   PT LONG TERM GOAL #2   Title Pt. will demo improved ability to  lift rt. leg in prone (hip extension) with ease    Baseline heavy   Time 6   Period Weeks   Status On-going   PT LONG TERM GOAL #3   Title Pt. will report decreased hip pain from 8/10 to 3/10 in order to lay on her side in bed to sleep   Baseline varies up to 6/10   Time 6   Period Weeks   Status On-going   PT LONG TERM GOAL #4   Title Pt will report improved ability to stand for one hour without increasing hip pain most of the time   Time 6   Period Weeks   Status Unable to assess               Plan - 05/01/15 1133     PT Next Visit Plan Ionto ? offer, tape offer,  check hip flexor stretch, work toward goals.   PT Home Exercise Plan Continue IT band area stretching.   Consulted and Agree with Plan of Care Patient        Problem List Patient Active Problem List   Diagnosis Date Noted  . Edema 04/17/2015  . Vitamin D deficiency 04/17/2015  . Routine general medical examination at a health care facility 09/27/2014  . Obesity (BMI 30.0-34.9) 09/27/2014  . OSA (obstructive sleep apnea) 09/02/2014  . 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 Hyperlipidemia 04/28/2011  . Insomnia 02/09/2008  . Hypothyroidism 12/15/2006  . Fibromyalgia 12/15/2006  . RENAL CALCULUS, HX OF 12/15/2006    [facility] 05/01/2015, 11:38 AM  [facility] [address] [place], [place], [address] Phone: [phone]   Fax:  [phone]     Melvenia Needles, PTA 05/01/2015 11:38 AM Phone: [phone] Fax: [phone]

## 2015-05-04 ENCOUNTER — Ambulatory Visit: Payer: Medicare Other | Admitting: Physical Therapy

## 2015-05-04 DIAGNOSIS — M7071 Other bursitis of hip, right hip: Secondary | ICD-10-CM | POA: Diagnosis not present

## 2015-05-04 DIAGNOSIS — M7072 Other bursitis of hip, left hip: Secondary | ICD-10-CM

## 2015-05-04 NOTE — Therapy (Signed)
[place], [place], [address] Phone: [phone]   Fax:  [phone]  Physical Therapy Treatment  Patient Details  Name: Andrea Mckay MRN: [id number] Date of Birth: [birth year] Referring Provider:  Olga Millers, MD  Encounter Date: 05/04/2015      PT End of Session - 05/04/15 0842    Visit Number 4   Number of Visits 12   Date for PT Re-Evaluation 05/31/15   PT Start Time 0800   PT Stop Time 0850   PT Time Calculation (min) 50 min      Past Medical History  Diagnosis Date  . Myalgia and myositis, unspecified   . Cancer 01/2006    melanoma  . Thyroid disease     hypothyroid  . Postmenopausal   . Goiter   . Fibromyalgia   . Kidney stone   . Allergy   . Arthritis     osteoarthritis  . Diabetes mellitus     diet controlled  . Hyperlipidemia   . Osteoporosis     osteopenia  . Heart murmur   . Foreign body     foot  . Diarrhea   . Candidiasis     vagina/vulva  . Tick bite   . Malignant melanoma   . S/P colonoscopy   . Coronary artery disease     Past Surgical History  Procedure Laterality Date  . Thyroidectomy  1982  . Abdominal hysterectomy  1987  . Knee arthroscopy w/ acl reconstruction    . Foot tendon surgery  1982    Left  . Ovarian cyst removal  1999    removed b/l   . Lipoma excision  01/20/2011    There were no vitals filed for this visit.  Visit Diagnosis:  Bursitis of hip, left  Hip bursitis, right      Subjective Assessment - 05/04/15 0806    Subjective Hips have been better. 10/10 pain over last 3 days since I was here last time and was on the elliptical at the gym. I have been living on a hot pack, asprin, gel to ease the pain. No pain now due to the asprin. The pain is in the mid lower back.    Currently in Pain? No/denies  10/10 without taking asprin                         [facility] Adult PT Treatment/Exercise - 05/04/15 0001    Lumbar Exercises: Stretches   Single Knee to Chest Stretch 2 reps;30 seconds   Lower Trunk Rotation 2 reps;10 seconds   Quadruped Mid Back Stretch 2 reps;30 seconds   Lumbar Exercises: Aerobic   Stationary Bike Nustep L 4 Le x 7 min   Modalities   Modalities Retail buyer Location Low back   Electrical Stimulation Action IFC   Electrical Stimulation Parameters 10   Electrical Stimulation Goals Pain   Ultrasound   Ultrasound Location Lower back Mid left > right    Ultrasound Parameters 100% 1.4 cm2 x 8 min 1 hz   Ultrasound Goals Pain                  PT Short Term Goals - 05/01/15 1135    PT SHORT TERM GOAL #1   Title Pt. will be I with initial HEP   Baseline says she is independent   Time 2   Period Weeks  Status On-going   PT SHORT TERM GOAL #2   Title Pt. will demonstrate and verbalize condition management sich as RICE and positioning   Baseline understands positioning   Time 2   Period Weeks   Status Partially Met           PT Long Term Goals - 05/01/15 1135    PT LONG TERM GOAL #1   Title Pt. will be I with advanced HEP   Time 6   Period Weeks   Status On-going   PT LONG TERM GOAL #2   Title Pt. will demo improved ability to  lift rt. leg in prone (hip extension) with ease    Baseline heavy   Time 6   Period Weeks   Status On-going   PT LONG TERM GOAL #3   Title Pt. will report decreased hip pain from 8/10 to 3/10 in order to lay on her side in bed to sleep   Baseline varies up to 6/10   Time 6   Period Weeks   Status On-going   PT LONG TERM GOAL #4   Title Pt will report improved ability to stand for one hour without increasing hip pain most of the time   Time 6   Period Weeks   Status Unable to assess               Plan - 05/04/15 0842    Clinical Impression Statement Pt reports new onset of LBP the attributes to using the lateral elliptical at the gym. Focued on  modalites for pain management due to pt already trying stretching at home without benefit. No progress toward goals due to new onset LBP. Pt reports LBP much more relaxed after treatment.    PT Next Visit Plan Check new back pain, Ionto ? offer, tape offer,  check hip flexor stretch, work toward goals.        Problem List Patient Active Problem List   Diagnosis Date Noted  . Edema 04/17/2015  . Vitamin D deficiency 04/17/2015  . Routine general medical examination at a health care facility 09/27/2014  . Obesity (BMI 30.0-34.9) 09/27/2014  . OSA (obstructive sleep apnea) 09/02/2014  . 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 Hyperlipidemia 04/28/2011  . Insomnia 02/09/2008  . Hypothyroidism 12/15/2006  . Fibromyalgia 12/15/2006  . RENAL CALCULUS, HX OF 12/15/2006    Dorene Ar, PTA 05/04/2015, 9:28 AM  [place], [place], [address] Phone: [phone]   Fax:  [phone]

## 2015-05-08 ENCOUNTER — Ambulatory Visit: Payer: Medicare Other | Admitting: Physical Therapy

## 2015-05-08 DIAGNOSIS — M7071 Other bursitis of hip, right hip: Secondary | ICD-10-CM

## 2015-05-08 DIAGNOSIS — M7072 Other bursitis of hip, left hip: Secondary | ICD-10-CM

## 2015-05-08 NOTE — Patient Instructions (Signed)
Use Tennis ball for glute release at home against wall or laying on back Perform Glute crossover stretch as needed

## 2015-05-08 NOTE — Therapy (Signed)
[place], [place], [address] Phone: [phone]   Fax:  [phone]  Physical Therapy Treatment  Patient Details  Name: Andrea Mckay MRN: [id number] Date of Birth: March 15, 1946 Referring Provider:  Olga Millers, MD  Encounter Date: 05/08/2015      PT End of Session - 05/08/15 1129    Visit Number 5   Number of Visits 12   Date for PT Re-Evaluation 05/31/15   PT Start Time 0930   PT Stop Time 1017   PT Time Calculation (min) 47 min   Activity Tolerance Patient tolerated treatment well   Behavior During Therapy [facility] for tasks assessed/performed      Past Medical History  Diagnosis Date  . Myalgia and myositis, unspecified   . Cancer 01/2006    melanoma  . Thyroid disease     hypothyroid  . Postmenopausal   . Goiter   . Fibromyalgia   . Kidney stone   . Allergy   . Arthritis     osteoarthritis  . Diabetes mellitus     diet controlled  . Hyperlipidemia   . Osteoporosis     osteopenia  . Heart murmur   . Foreign body     foot  . Diarrhea   . Candidiasis     vagina/vulva  . Tick bite   . Malignant melanoma   . S/P colonoscopy   . Coronary artery disease     Past Surgical History  Procedure Laterality Date  . Thyroidectomy  1982  . Abdominal hysterectomy  1987  . Knee arthroscopy w/ acl reconstruction    . Foot tendon surgery  1982    Left  . Ovarian cyst removal  1999    removed b/l   . Lipoma excision  01/20/2011    There were no vitals filed for this visit.  Visit Diagnosis:  Bursitis of hip, left  Hip bursitis, right      Subjective Assessment - 05/08/15 0934    Subjective Pt states she feels good today. Back pain was relieved by getting in the sauna on Friday or Saturday last week 9/16 or 9/17.   Currently in Pain? No/denies   Pain Score 0-No pain  pain with palpation 6/10   Pain Location Hip   Pain Orientation Right;Left   Pain Descriptors / Indicators --   Burning with palpation/manual pressure   Pain Type Chronic pain   Pain Radiating Towards none   Pain Onset More than a month ago   Pain Frequency Other (Comment)  only with manual pressure   Aggravating Factors  lateral elliptical machine   Pain Relieving Factors nothing so far   Multiple Pain Sites No            [facility] Adult PT Treatment/Exercise - 05/08/15 1123    Self-Care   Self-Care Other Self-Care Comments   Other Self-Care Comments  Tennis ball massage against wall, supine or with hand for pain relief   Lumbar Exercises: Stretches   ITB Stretch 2 reps;20 seconds;Other (comment)   ITB Stretch Limitations sidelying, with manual over pressure, not hitting the area where she is having the pain. Tried Glute stretch/SI distraction in supine with legs crossed spinal rotation (seemed to hit the area). both sides 15sec hold   Manual Therapy   Manual therapy comments Soft tissue massage bilateral hips/superior Glute and QL 109mn/side with biofreeze in addition before completion           PT Education -  05/08/15 1127    Education provided Yes   Education Details Body's anatomy in the area of her pain, Tennis ball massage, stay off lateral elliptical, rest breaks when using bike   Person(s) Educated Patient   Methods Explanation   Comprehension Verbalized understanding          PT Short Term Goals - 05/01/15 1135    PT SHORT TERM GOAL #1   Title Pt. will be I with initial HEP   Baseline says she is independent   Time 2   Period Weeks   Status On-going   PT SHORT TERM GOAL #2   Title Pt. will demonstrate and verbalize condition management sich as RICE and positioning   Baseline understands positioning   Time 2   Period Weeks   Status Partially Met           PT Long Term Goals - 05/01/15 1135    PT LONG TERM GOAL #1   Title Pt. will be I with advanced HEP   Time 6   Period Weeks   Status On-going   PT LONG TERM GOAL #2   Title Pt. will demo improved  ability to  lift rt. leg in prone (hip extension) with ease    Baseline heavy   Time 6   Period Weeks   Status On-going   PT LONG TERM GOAL #3   Title Pt. will report decreased hip pain from 8/10 to 3/10 in order to lay on her side in bed to sleep   Baseline varies up to 6/10   Time 6   Period Weeks   Status On-going   PT LONG TERM GOAL #4   Title Pt will report improved ability to stand for one hour without increasing hip pain most of the time   Time 6   Period Weeks   Status Unable to assess           Plan - 05/08/15 1130    Clinical Impression Statement Pt reported back pain was relieved after using the sauna this past weekend. Pt. bilateral hips are very tender to palpation. Manual Massage performed and patient reported "it feels a lot better". Pt. Left glute and QL with taut bands of muscle, right side only the superior glute affected.  Pt. is still working towards meeting goals.   PT Next Visit Plan Ionto?, offer tape? manual to bilateral hips, assess Goals   PT Home Exercise Plan tennis ball self massage, Glute knee crossover stretch   Consulted and Agree with Plan of Care Patient        Problem List Patient Active Problem List   Diagnosis Date Noted  . Edema 04/17/2015  . Vitamin D deficiency 04/17/2015  . Routine general medical examination at a health care facility 09/27/2014  . Obesity (BMI 30.0-34.9) 09/27/2014  . OSA (obstructive sleep apnea) 09/02/2014  . 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 Hyperlipidemia 04/28/2011  . Insomnia 02/09/2008  . Hypothyroidism 12/15/2006  . Fibromyalgia 12/15/2006  . RENAL CALCULUS, HX OF 12/15/2006   Radonna Ricker, SPT   PAA,JENNIFER 05/08/2015, 12:37 PM  [facility] [address] [place], [place], [address] Phone: [phone]   Fax:  [phone]   Raeford Razor, PT 05/08/2015 12:37 PM Phone: [phone] Fax: [phone]

## 2015-05-11 ENCOUNTER — Ambulatory Visit: Payer: Medicare Other | Admitting: Physical Therapy

## 2015-05-11 DIAGNOSIS — M7072 Other bursitis of hip, left hip: Secondary | ICD-10-CM

## 2015-05-11 DIAGNOSIS — M7071 Other bursitis of hip, right hip: Secondary | ICD-10-CM | POA: Diagnosis not present

## 2015-05-11 NOTE — Therapy (Signed)
[place], [place], [address] Phone: [phone]   Fax:  [phone]  Physical Therapy Treatment  Patient Details  Name: Andrea Mckay MRN: [id number] Date of Birth: [birth year] Referring Kalani Sthilaire:  Olga Millers, MD  Encounter Date: 05/11/2015      PT End of Session - 05/11/15 0936    Visit Number 6   Number of Visits 12   Date for PT Re-Evaluation 05/31/15   PT Start Time 0934   PT Stop Time 1012   PT Time Calculation (min) 38 min      Past Medical History  Diagnosis Date  . Myalgia and myositis, unspecified   . Cancer 01/2006    melanoma  . Thyroid disease     hypothyroid  . Postmenopausal   . Goiter   . Fibromyalgia   . Kidney stone   . Allergy   . Arthritis     osteoarthritis  . Diabetes mellitus     diet controlled  . Hyperlipidemia   . Osteoporosis     osteopenia  . Heart murmur   . Foreign body     foot  . Diarrhea   . Candidiasis     vagina/vulva  . Tick bite   . Malignant melanoma   . S/P colonoscopy   . Coronary artery disease     Past Surgical History  Procedure Laterality Date  . Thyroidectomy  1982  . Abdominal hysterectomy  1987  . Knee arthroscopy w/ acl reconstruction    . Foot tendon surgery  1982    Left  . Ovarian cyst removal  1999    removed b/l   . Lipoma excision  01/20/2011    There were no vitals filed for this visit.  Visit Diagnosis:  Bursitis of hip, left  Hip bursitis, right      Subjective Assessment - 05/11/15 0935    Subjective No pain today. Tried the tennis ball massage-it hurt too much so I stopped   Currently in Pain? No/denies            [facility] PT Assessment - 05/11/15 0001    Strength   Right Hip Extension 4/5   Right Hip ABduction 4+/5  glut med 4/5   Left Hip ABduction 4+/5  glut med 4/5                     [facility] Adult PT Treatment/Exercise - 05/11/15 0001    Lumbar Exercises: Stretches   ITB  Stretch 2 reps;20 seconds;Other (comment)   ITB Stretch Limitations sidelying, with manual over pressure, not hitting the area where she is having the pain. Tried Glute stretch/SI distraction in supine with legs crossed spinal rotation (seemed to hit the area). both sides 15sec hold   Piriformis Stretch 2 reps;30 seconds   Lumbar Exercises: Aerobic   Stationary Bike Nustep L 4 Le x 5 min   Knee/Hip Exercises: Sidelying   Hip ABduction 2 sets;10 reps   Other Sidelying Knee/Hip Exercises also sidelying bicycles forward and ack x 10 each side with rest breaks needed due to muscle fatigue, small mixing bowls x 10 each side-   Knee/Hip Exercises: Prone   Hip Extension 2 sets;10 reps   Hip Extension Limitations also 2 x 10 donkey kicks   Manual Therapy   Manual therapy comments soft tissue work bilateral glute med in sidelying  PT Short Term Goals - 05/11/15 0936    PT SHORT TERM GOAL #1   Title Pt. will be I with initial HEP   Time 2   Period Weeks   Status Achieved   PT SHORT TERM GOAL #2   Title Pt. will demonstrate and verbalize condition management sich as RICE and positioning   Time 2   Period Weeks   Status Achieved           PT Long Term Goals - 05/11/15 9629    PT LONG TERM GOAL #1   Title Pt. will be I with advanced HEP   Time 6   Period Weeks   PT LONG TERM GOAL #2   Title Pt. will demo improved ability to  lift rt. leg in prone (hip extension) with ease    Time 6   Period Weeks   Status Achieved   PT LONG TERM GOAL #3   Title Pt. will report decreased hip pain from 8/10 to 3/10 in order to lay on her side in bed to sleep   Time 6   Period Weeks   Status Achieved   PT LONG TERM GOAL #4   Title Pt will report improved ability to stand for one hour without increasing hip pain most of the time   Time 6   Period Weeks   Status On-going  15-20 minutes               Plan - 05/11/15 0949    Clinical Impression Statement LTG # 2,  3 achieved. Continued glut weakness. Instructed pt in glut max/ med strengthening and stretching with pt reporting increased left hip pain afterward. Pain resolved after soft tissue work using roller.    PT Next Visit Plan Ionto?- POC not signed, offer tape? manual to bilateral hips, glut med/max stretngthening, check standing tolerance after festival        Problem List Patient Active Problem List   Diagnosis Date Noted  . Edema 04/17/2015  . Vitamin D deficiency 04/17/2015  . Routine general medical examination at a health care facility 09/27/2014  . Obesity (BMI 30.0-34.9) 09/27/2014  . OSA (obstructive sleep apnea) 09/02/2014  . 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 Hyperlipidemia 04/28/2011  . Insomnia 02/09/2008  . Hypothyroidism 12/15/2006  . Fibromyalgia 12/15/2006  . RENAL CALCULUS, HX OF 12/15/2006    Dorene Ar, PTA 05/11/2015, 10:15 AM  [place], [place], [address] Phone: [phone]   Fax:  [phone]

## 2015-05-14 ENCOUNTER — Ambulatory Visit: Payer: Medicare Other | Admitting: Physical Therapy

## 2015-05-14 DIAGNOSIS — M7071 Other bursitis of hip, right hip: Secondary | ICD-10-CM | POA: Diagnosis not present

## 2015-05-14 DIAGNOSIS — M7072 Other bursitis of hip, left hip: Secondary | ICD-10-CM

## 2015-05-14 NOTE — Therapy (Signed)
[place], [place], [address] Phone: [phone]   Fax:  [phone]  Physical Therapy Treatment  Patient Details  Name: Andrea Mckay MRN: [id number] Date of Birth: 1946-07-26 Referring Provider:  Eliezer Lofts, PA-C  Encounter Date: 05/14/2015      PT End of Session - 05/14/15 1032    Visit Number 7   Number of Visits 12   Date for PT Re-Evaluation 05/31/15   PT Start Time 1020   PT Stop Time 1100   PT Time Calculation (min) 40 min   Activity Tolerance Patient tolerated treatment well  My low back feels great   Behavior During Therapy [facility] for tasks assessed/performed      Past Medical History  Diagnosis Date  . Myalgia and myositis, unspecified   . Cancer 01/2006    melanoma  . Thyroid disease     hypothyroid  . Postmenopausal   . Goiter   . Fibromyalgia   . Kidney stone   . Allergy   . Arthritis     osteoarthritis  . Diabetes mellitus     diet controlled  . Hyperlipidemia   . Osteoporosis     osteopenia  . Heart murmur   . Foreign body     foot  . Diarrhea   . Candidiasis     vagina/vulva  . Tick bite   . Malignant melanoma   . S/P colonoscopy   . Coronary artery disease     Past Surgical History  Procedure Laterality Date  . Thyroidectomy  1982  . Abdominal hysterectomy  1987  . Knee arthroscopy w/ acl reconstruction    . Foot tendon surgery  1982    Left  . Ovarian cyst removal  1999    removed b/l   . Lipoma excision  01/20/2011    There were no vitals filed for this visit.  Visit Diagnosis:  Bursitis of hip, left  Hip bursitis, right      Subjective Assessment - 05/14/15 1021    Subjective Rt. leg is not right. Swollen, tight and hurts (mm) heavy.   Was really sore from the leg lifts.  This Rt. leg does not respond sometimess.  Random pain in calf.  DVT r/o by blood test and Doppler (1 month). PT asking about referral, we had a copy but she thought it said  something about adhesive capsulitis.    Currently in Pain? No/denies        Pilates Reformer used for LE/core strength, postural strength, lumbopelvic disassociation and core control.  Exercises included: Footwork 2 Red 1 Blue parallel heels and toes.  Heel raises in parallel. Extended time for Rt. Calf stretch.Single leg 2 red 1 blue x 10 each Feet in Straps 1 Red 1 Yellow arcs, parallel/hip ER and circles  Ongoing cues for maintaining pelvic stability, using abdominals and full knee extension Reverse Abdominals 1 Blue x 10 with UE,  Added lower abd x 10 with min difficulty. Increased tension in hands, wrists.  Poor scapular stability.  Long box Prone overhead press 1 Red with tactile cueing x10 each and single arm for strengthening.  Swan with cues for thoracic ext and scapular stab.  Reports feeling great post, energy in back, no pain at all in hips.         PT Education - 05/14/15 1032    Education provided Yes   Education Details PIlates Reformer and concepts   Person(s) Educated Patient   Methods Explanation  Comprehension Verbalized understanding          PT Short Term Goals - 05/14/15 1034    PT SHORT TERM GOAL #1   Title Pt. will be I with initial HEP   Status Achieved   PT SHORT TERM GOAL #2   Title Pt. will demonstrate and verbalize condition management sich as RICE and positioning   Status Achieved           PT Long Term Goals - 05/11/15 0937    PT LONG TERM GOAL #1   Title Pt. will be I with advanced HEP   Time 6   Period Weeks   PT LONG TERM GOAL #2   Title Pt. will demo improved ability to  lift rt. leg in prone (hip extension) with ease    Time 6   Period Weeks   Status Achieved   PT LONG TERM GOAL #3   Title Pt. will report decreased hip pain from 8/10 to 3/10 in order to lay on her side in bed to sleep   Time 6   Period Weeks   Status Achieved   PT LONG TERM GOAL #4   Title Pt will report improved ability to stand for one hour without  increasing hip pain most of the time   Time 6   Period Weeks   Status On-going  15-20 minutes               Plan - 05/14/15 1328    Clinical Impression Statement Focused today on exercising and proper use of core/abdominals with LE movement.  Pt. responsive and appreciative.    PT Next Visit Plan Ionto?- POC not signed, offer tape? manual to bilateral hips, glut med/max stretngthening, check specific goals    PT Home Exercise Plan tennis ball self massage, Glute knee crossover stretch, review all   Consulted and Agree with Plan of Care Patient        Problem List Patient Active Problem List   Diagnosis Date Noted  . Edema 04/17/2015  . Vitamin D deficiency 04/17/2015  . Routine general medical examination at a health care facility 09/27/2014  . Obesity (BMI 30.0-34.9) 09/27/2014  . OSA (obstructive sleep apnea) 09/02/2014  . 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 Hyperlipidemia 04/28/2011  . Insomnia 02/09/2008  . Hypothyroidism 12/15/2006  . Fibromyalgia 12/15/2006  . RENAL CALCULUS, HX OF 12/15/2006    PAA,JENNIFER 05/14/2015, 1:30 PM  [facility] [address] [place], [place], [address] Phone: [phone]   Fax:  [phone]  Raeford Razor, PT 05/14/2015 1:30 PM Phone: [phone] Fax: [phone]

## 2015-05-15 ENCOUNTER — Encounter: Payer: Self-pay | Admitting: Neurology

## 2015-05-15 ENCOUNTER — Ambulatory Visit (INDEPENDENT_AMBULATORY_CARE_PROVIDER_SITE_OTHER): Payer: Medicare Other | Admitting: Neurology

## 2015-05-15 VITALS — BP 104/72 | HR 76 | Resp 16 | Ht 65.0 in | Wt 198.0 lb

## 2015-05-15 DIAGNOSIS — E669 Obesity, unspecified: Secondary | ICD-10-CM

## 2015-05-15 DIAGNOSIS — G4733 Obstructive sleep apnea (adult) (pediatric): Secondary | ICD-10-CM

## 2015-05-15 DIAGNOSIS — Z9989 Dependence on other enabling machines and devices: Principal | ICD-10-CM

## 2015-05-15 NOTE — Patient Instructions (Addendum)

## 2015-05-15 NOTE — Progress Notes (Signed)
Subjective:    Patient ID: Andrea Mckay is a 69 y.o. female.  HPI     Interim history:   Andrea Mckay is a 69 year old right-handed woman with an underlying medical history of hyperlipidemia, hypothyroidism, mitral valve prolapse, fibromyalgia (saw Dr. Estanislado Pandy) and obesity, who presents for follow-up consultation of Andrea Mckay obstructive sleep apnea, after Andrea Mckay recent sleep studies. The patient is unaccompanied today. I last saw Andrea Mckay on 11/09/2014, at which time she reported feeling much improved with regards to Andrea Mckay sleep. She also felt that Andrea Mckay concentration mood irritability were much better. She noted better sleep consolidation and less daytime somnolence. She even felt improvement in Andrea Mckay occasional restless leg symptoms. She was fully compliant with treatment. She had some nasal irritation with a nasal pillows but this improved. Andrea Mckay nocturia was about the same. Andrea Mckay dry eyes which she had chronically became a little bit worse with the CPAP air. She denied any previous issues with chronic lung disease. She had seen a pulmonologist in the distant past. She did admit to smoking memory Andrea Mckay for about 20 years but quit many years ago. She had seen a cardiologist years ago.   Today, 05/15/2015: I reviewed Andrea Mckay CPAP compliance data from 04/14/2015 through 05/13/2015 which is a total of 30 days during which time she used Andrea Mckay machine every night with percent used days greater than 4 hours at 97%, indicating excellent compliance with an average usage of 6 hours and 23 minutes, residual AHI low at 1 per hour, leak acceptable with the 95th percentile at 14.4 L/m at a pressure of 10 cm with EPR of 3.  Today, 05/15/2015: She reports doing well, but has issues with allergy to Andrea Mckay masks. She has tried many different masks. Has been using a barrier creme, and emu oil. The best she can do is alternate between the different masks she has available. Overall, she feels well with CPAP therapy and endorses improved sleep and  mood and is motivated to continue with treatment. She has been able to lose some weight, is doing a fast metabolism diet and eats a lot of vegetables and fruits. 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   Previously:  I first met Andrea Mckay on 06/13/2014 at the request of Andrea Mckay primary care provider, at which time she reported difficulty with sleep maintenance, snoring and witnessed apneas. I invited Andrea Mckay back for sleep study. She had a baseline sleep study on 08/01/2014 followed by a CPAP titration study on 08/20/2014 and underwent over Andrea Mckay test results in detail with Andrea Mckay today. Andrea Mckay baseline sleep study from 08/01/2014 showed a sleep efficiency of 85.5% with a latency to sleep of 14 minutes and wake after sleep onset of 56 minutes with moderate to severe sleep fragmentation noted. She had an increased percentage of stage II sleep, 14.3% of deep sleep, and a mildly reduced percentage of REM sleep at 18.1% with a borderline reduced REM latency at 69 minutes. She had no significant periodic leg movements of sleep or EKG changes or EEG changes. She had moderate to loud snoring. She had a total AHI of 6.6 per hour, rising to 17.7 per hour during REM sleep. Andrea Mckay average oxygen saturation was in the high 80s. Supplemental oxygen was started at 1 L/m to help maintain Andrea Mckay oxygen saturations in the 90s. With supplemental oxygen Andrea Mckay average oxygen saturation was 90%, and Andrea Mckay desaturation nadir reached 81%. Based on Andrea Mckay desaturations I invited Andrea Mckay back for CPAP titration. She had this on 08/20/2014.  Latency to sleep was 21 minutes, wake after sleep onset was 84 minutes with moderate sleep fragmentation noted. She had a normal arousal index. She had an increased percentage of stage II sleep. She had 9.5% of deep sleep and a normal percentage of REM sleep at 20.1%. She had no significant PLMS. She had no significant EKG changes or EEG changes. Baseline oxygen saturation was 93%, nadir was 86%. CPAP was started at 5 cm  with a small nasal pillows interface. CPAP was titrated to a pressure of 11 cm and the AHI was reduced to 0.6 per hour at the pressure of 10 cm. The study was conducted without supplemental oxygen. Based on Andrea Mckay test results are prescribed CPAP therapy for home use.   I reviewed Andrea Mckay CPAP compliance data from 10/08/2014 through 11/06/2014 which is a total of 30 days during which time she used Andrea Mckay machine every night with percent used days greater than 4 hours of 100%, indicating superb compliance with an average usage of 7 hours and 39 minutes, residual AHI low at 1.9 per hour on a pressure of 10 cm with EPR of 3 and leak acceptable with the 95th percentile at 13.6 L/m.    She moved from [place] a month ago. She has intermittent RLS symptoms and has tried herbal and natural remedies. She sleeps alone. She has been told, that snoring can be loud. She has never had a sleep study. She is very sensitive to medications and may have tried trazodone one time, which gave Andrea Mckay a very bad "hangover". She has woken up from Andrea Mckay own snoring and from Andrea Mckay leg kicking and leg jerking. She denies morning HAs. She has been on H1 and H2 blockers for an itchy rash and has been sleeping better. She may nap sometimes. She has an ESS of 2/24 today, Andrea Mckay fatigue score is 4. She drinks coffee in the morning and green tea before 3 PM.   She has nocturia x 1 per night.   Andrea Mckay mother has OSA and is on CPAP.    Andrea Mckay typical bedtime is reported to be around 8 or 9 PM and usual wake time is around 4 to 5 AM, because she is awake. 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 She feels adequately rested upon awakening. She wakes up on an average 1 times in the middle of the night and has to go to the bathroom 1 times on a typical night. She denies morning headaches. She has not fallen asleep while driving. She has no nighttime reflux; she has nasal congestion.   She is a restless sleeper and in the morning,  the bed is quite disheveled.    She denies cataplexy, sleep paralysis, hypnagogic or hypnopompic hallucinations, or sleep attacks. She does not report any vivid dreams, nightmares, dream enactments, or parasomnias, such as sleep talking or sleep walking. The patient has not had a sleep study or a home sleep test. Andrea Mckay bedroom is usually dark and cool. There is no TV in the bedroom.   She is a non-smoker and drinks alcohol occasionally.   Andrea Mckay Past Medical History Is Significant For: Past Medical History  Diagnosis Date  . Myalgia and myositis, unspecified   . Cancer 01/2006    melanoma  . Thyroid disease     hypothyroid  . Postmenopausal   . Goiter   . Fibromyalgia   . Kidney stone   . Allergy   . Arthritis  osteoarthritis  . Diabetes mellitus     diet controlled  . Hyperlipidemia   . Osteoporosis     osteopenia  . Heart murmur   . Foreign body     foot  . Diarrhea   . Candidiasis     vagina/vulva  . Tick bite   . Malignant melanoma   . S/P colonoscopy   . Coronary artery disease     Andrea Mckay Past Surgical History Is Significant For: Past Surgical History  Procedure Laterality Date  . Thyroidectomy  1982  . Abdominal hysterectomy  1987  . Knee arthroscopy w/ acl reconstruction    . Foot tendon surgery  1982    Left  . Ovarian cyst removal  1999    removed b/l   . Lipoma excision  01/20/2011    Andrea Mckay Family History Is Significant For: Family History  Problem Relation Age of Onset  . Cancer Paternal Grandmother     pancreatic  . Pancreatic cancer Paternal Grandmother   . Diabetes Maternal Aunt   . Prostate cancer    . Atrial fibrillation Mother   . Hypertension Mother   . Colon polyps Mother   . Colon polyps Daughter     Adenomatous  . Cancer Father 62    prostate  . Parkinsonism Paternal Uncle   . Heart disease Maternal Grandmother 75    MI  . Cancer Maternal Aunt 80    colon    Andrea Mckay Social History Is Significant For: Social History   Social History  .  Marital Status: Divorced    Spouse Name: N/A  . Number of Children: N/A  . Years of Education: N/A   Occupational History  . accounting P/T    Social History Main Topics  . Smoking status: Never Smoker   . Smokeless tobacco: Never Used  . Alcohol Use: 0.0 oz/week    0 Standard drinks or equivalent per week     Comment: Socially  . Drug Use: No  . Sexual Activity: No   Other Topics Concern  . None   Social History Narrative    Andrea Mckay Allergies Are:  Allergies  Allergen Reactions  . Erythromycin Rash  . Iodine Nausea Only, Rash and Other (See Comments)    arrythmia  . Latex Rash and Cough  . Sulfonamide Derivatives   . Cephalosporins   . Ciprofloxacin   . Doxycycline   . Iohexol   . Lasix [Furosemide]   . Moxifloxacin   . Nabumetone   . Nitrofurantoin   . Nsaids Other (See Comments)    Told to avoid because of kidneys  . Ofloxacin   . Oxaprozin   . Quinolones   . Risedronate Sodium   . Soy Allergy   . Spironolactone   . Levothyroxine Sodium Palpitations  :   Andrea Mckay Current Medications Are:  Outpatient Encounter Prescriptions as of 05/15/2015  Medication Sig  . aspirin 81 MG tablet Take 81 mg by mouth daily.  Marland Kitchen conjugated estrogens (PREMARIN) vaginal cream Place 1 Applicatorful vaginally daily. For 2 weeks, then twice a week  . ethacrynic acid (EDECRIN) 25 MG tablet Take 1 tablet (25 mg total) by mouth daily as needed. Take as needed for fluid retention.  . nitroGLYCERIN (NITROSTAT) 0.4 MG SL tablet Place 1 tablet (0.4 mg total) under the tongue every 5 (five) minutes as needed.  . NON FORMULARY Eucerin PRN    . predniSONE (DELTASONE) 10 MG tablet Take 10 mg by mouth daily with breakfast.  . Thyroid  146.25 MG TABS Take 146.25 mg by mouth daily.  Marland Kitchen UNABLE TO FIND Med Name: H2 Inhibitor  . [DISCONTINUED] EPINEPHrine (EPIPEN) 0.3 mg/0.3 mL DEVI Inject 0.3 mg into the muscle once.     No facility-administered encounter medications on file as of 05/15/2015.   :  Review of Systems:  Out of a complete 14 point review of systems, all are reviewed and negative with the exception of these symptoms as listed below:   Review of Systems  Neurological:       Patient states that she does not sleep well at night with CPAP. She reports being allergic to silicone on mask. Feels like she is a restless sleeper. But reports feeling better since using CPAP.     Objective:  Neurologic Exam  Physical Exam Physical Examination:   Filed Vitals:   05/15/15 1107  BP: 104/72  Pulse: 76  Resp: 16    General Examination: The patient is a very pleasant 69 y.o. female in no acute distress. She appears well-developed and well-nourished and well groomed. She is in good spirits today.  HEENT: Normocephalic, atraumatic, pupils are equal, round and reactive to light and accommodation. Extraocular tracking is good without limitation to gaze excursion or nystagmus noted. Normal smooth pursuit is noted. Hearing is grossly intact. Face is symmetric with normal facial animation and normal facial sensation. Speech is clear with no dysarthria noted. There is no hypophonia. There is no lip, neck/head, jaw or voice tremor. Neck is supple with full range of passive and active motion. There are no carotid bruits on auscultation. Oropharynx exam reveals: Mild to moderate mouth dryness, adequate dental hygiene and moderate airway crowding, due to narrow airway entry and redundant soft palate. Mallampati is class II. Tongue protrudes centrally and palate elevates symmetrically. Tonsils are 1+, R a little bigger than L. She has a Mild overbite. Nasal inspection reveals no significant nasal mucosal bogginess or redness and no septal deviation. She has a very mild degree of redness underneath both nostrils and on Andrea Mckay cheeks.  Chest: Clear to auscultation without wheezing, rhonchi or crackles noted.  Heart: S1+S2+0, regular and normal without murmurs, rubs or gallops noted.   Abdomen:  Soft, non-tender and non-distended with normal bowel sounds appreciated on auscultation.  Extremities: There is no pitting edema in the distal lower extremities bilaterally. Pedal pulses are intact.  Skin: Warm and dry without trophic changes noted. There are no varicose veins.  Musculoskeletal: exam reveals no obvious joint deformities, tenderness or joint swelling or erythema.   Neurologically:  Mental status: The patient is awake, alert and oriented in all 4 spheres. Andrea Mckay immediate and remote memory, attention, language skills and fund of knowledge are appropriate. There is no evidence of aphasia, agnosia, apraxia or anomia. Speech is clear with normal prosody and enunciation. Thought process is linear. Mood is normal and affect is normal.  Cranial nerves II - XII are as described above under HEENT exam. In addition: shoulder shrug is normal with equal shoulder height noted. Motor exam: Normal bulk, strength and tone is noted. There is no drift, tremor or rebound. Romberg is negative. Reflexes are 2+ throughout. Babinski: Toes are flexor bilaterally. Fine motor skills and coordination: intact with normal finger taps, normal hand movements, normal rapid alternating patting, normal foot taps and normal foot agility.  Cerebellar testing: No dysmetria or intention tremor on finger to nose testing. Heel to shin is unremarkable bilaterally. There is no truncal or gait ataxia.  Sensory exam: intact to light  touch in the upper and lower extremities.  Gait, station and balance: She stands easily. No veering to one side is noted. No leaning to one side is noted. Posture is age-appropriate and stance is narrow based. Gait shows normal stride length and normal pace. No problems turning are noted. She turns en bloc. Tandem walk is slightly difficult for Andrea Mckay, unchanged.     Assessment and Plan:   In summary, ARIELL GUNNELS is a very pleasant 69 year old female with an underlying medical history of  hyperlipidemia, hypothyroidism, mitral valve prolapse, fibromyalgia (saw Dr. Estanislado Pandy) and obesity, who presents for follow-up consultation of Andrea Mckay obstructive sleep apnea, on treatment with CPAP with good compliance. Andrea Mckay baseline sleep study from 08/01/2014 showed overall mild obstructive sleep apnea by number of events but she had significant desaturations and hypoxemia with time below 88% saturation of over 80 minutes. Supplemental oxygen was utilized during the first study to maintain oxygen saturations. She had a CPAP titration study which showed improved oxygen saturation just by way of using CPAP. While she has been compliant with treatment and reported improved sleep, improved daytime somnolence, and sleep consolidation, and improved mood, but she has had some interim problems with the mask. She does have a number of allergies. She is trying to make it work by way of alternating Andrea Mckay masks and using barrier oil. We talked about Andrea Mckay compliance data today. She is again advised regarding the risks and ramifications of untreated obstructive sleep apnea and significant nighttime desaturations. Most of Andrea Mckay symptoms have improved. Symptoms of untreated OSA include daytime sleepiness, memory problems, mood irritability and mood disorder such as depression and anxiety, lack of energy, as well as recurrent headaches, especially morning headaches. We talked about trying to maintain a healthy lifestyle in general, as well as the importance of weight control. I encouraged the patient to eat healthy, exercise daily and keep well hydrated, to keep a scheduled bedtime and wake time routine, to not skip any meals and eat healthy snacks in between meals. I advised the patient not to drive when feeling sleepy. I recommended the following at this time: Continue CPAP treatment regularly. She is doing well from my end of things and I would like to see Andrea Mckay back in12 months, sooner if the need arises.  I explained the importance  of being compliant with PAP treatment, not only for insurance purposes but primarily to improve Andrea Mckay symptoms, and for the patient's long term health benefit, including to reduce Andrea Mckay cardiovascular risks.  I answered all Andrea Mckay questions today and the patient was in agreement. I encouraged Andrea Mckay to call with any interim questions, concerns, problems or updates.  I spent 20 minutes in total face-to-face time with the patient, more than 50% of which was spent in counseling and coordination of care, reviewing test results, reviewing medication and discussing or reviewing the diagnosis of OSA, its prognosis and treatment options.

## 2015-05-17 ENCOUNTER — Telehealth: Payer: Self-pay

## 2015-05-17 ENCOUNTER — Ambulatory Visit: Payer: Medicare Other | Admitting: Physical Therapy

## 2015-05-17 DIAGNOSIS — M7071 Other bursitis of hip, right hip: Secondary | ICD-10-CM | POA: Diagnosis not present

## 2015-05-17 DIAGNOSIS — M7072 Other bursitis of hip, left hip: Secondary | ICD-10-CM

## 2015-05-17 DIAGNOSIS — Z9989 Dependence on other enabling machines and devices: Principal | ICD-10-CM

## 2015-05-17 DIAGNOSIS — G4733 Obstructive sleep apnea (adult) (pediatric): Secondary | ICD-10-CM

## 2015-05-17 NOTE — Telephone Encounter (Signed)
Mask refit requested, please notify DME.

## 2015-05-17 NOTE — Telephone Encounter (Signed)
Andrea Mckay from [facility] got back with me and states that they do have poly CPAP masks. Can we order a mask refit?

## 2015-05-17 NOTE — Therapy (Signed)
[place] [place], [place], [address] Phone: [phone]   Fax:  [phone]  Physical Therapy Treatment  Patient Details  Name: Andrea Mckay MRN: [id number] Date of Birth: April 07, 1946 Referring Provider:  Hoyt Koch, *  Encounter Date: 05/17/2015      PT End of Session - 05/17/15 1117    Visit Number 8   Number of Visits 12   Date for PT Re-Evaluation 05/31/15   PT Start Time 1108   PT Stop Time 1149   PT Time Calculation (min) 41 min      Past Medical History  Diagnosis Date  . Myalgia and myositis, unspecified   . Cancer 01/2006    melanoma  . Thyroid disease     hypothyroid  . Postmenopausal   . Goiter   . Fibromyalgia   . Kidney stone   . Allergy   . Arthritis     osteoarthritis  . Diabetes mellitus     diet controlled  . Hyperlipidemia   . Osteoporosis     osteopenia  . Heart murmur   . Foreign body     foot  . Diarrhea   . Candidiasis     vagina/vulva  . Tick bite   . Malignant melanoma   . S/P colonoscopy   . Coronary artery disease     Past Surgical History  Procedure Laterality Date  . Thyroidectomy  1982  . Abdominal hysterectomy  1987  . Knee arthroscopy w/ acl reconstruction    . Foot tendon surgery  1982    Left  . Ovarian cyst removal  1999    removed b/l   . Lipoma excision  01/20/2011    There were no vitals filed for this visit.  Visit Diagnosis:  Bursitis of hip, left  Hip bursitis, right                       [facility] Adult PT Treatment/Exercise - 05/17/15 1126    Lumbar Exercises: Aerobic   Stationary Bike Nustep L 4 Le x 5 min   Lumbar Exercises: Supine   Other Supine Lumbar Exercises Pilates Reformer exercises- foot work 2 red 1 yellow on heel , on toes, in parallel and ER.    Other Supine Lumbar Exercises Supine abdominal crunch- pt able to left shoulder off mat with cues.  5 sec x 10, then Level 1 and level 2 scissors with  cues for neutral spine                  PT Short Term Goals - 05/14/15 1034    PT SHORT TERM GOAL #1   Title Pt. will be I with initial HEP   Status Achieved   PT SHORT TERM GOAL #2   Title Pt. will demonstrate and verbalize condition management sich as RICE and positioning   Status Achieved           PT Long Term Goals - 05/11/15 0937    PT LONG TERM GOAL #1   Title Pt. will be I with advanced HEP   Time 6   Period Weeks   PT LONG TERM GOAL #2   Title Pt. will demo improved ability to  lift rt. leg in prone (hip extension) with ease    Time 6   Period Weeks   Status Achieved   PT LONG TERM GOAL #3   Title Pt. will report decreased hip pain from 8/10  to 3/10 in order to lay on her side in bed to sleep   Time 6   Period Weeks   Status Achieved   PT LONG TERM GOAL #4   Title Pt will report improved ability to stand for one hour without increasing hip pain most of the time   Time 6   Period Weeks   Status On-going  15-20 minutes               Plan - 05/17/15 1118    Clinical Impression Statement Had increased pain along upper back after last visit for the last 2 days. No pain today. Lower back and hips have ben good, no pain. 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 She would like her left shoulder and bilateral knee evaluated. Will contact MD if new referral is needed. Pt does not think she can stand for 1 hour without hip pain. [place]. Pt declines modaliities at end of treatment and reports no increased pain.    PT Next Visit Plan Evaluate left shoulder and bilateral knees-get new referral if needed.         Problem List Patient Active Problem List   Diagnosis Date Noted  . Edema 04/17/2015  . Vitamin D deficiency 04/17/2015  . Routine general medical examination at a health care facility 09/27/2014  . Obesity (BMI 30.0-34.9) 09/27/2014  . OSA  (obstructive sleep apnea) 09/02/2014  . 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 Hyperlipidemia 04/28/2011  . Insomnia 02/09/2008  . Hypothyroidism 12/15/2006  . Fibromyalgia 12/15/2006  . RENAL CALCULUS, HX OF 12/15/2006    Dorene Ar, PTA 05/17/2015, 1:46 PM  [facility] [place], [place], [address] Phone: [phone]   Fax:  [phone]

## 2015-05-17 NOTE — Telephone Encounter (Signed)
I will let Andrea Mckay know.

## 2015-05-19 ENCOUNTER — Encounter: Payer: Self-pay | Admitting: Internal Medicine

## 2015-05-22 ENCOUNTER — Ambulatory Visit: Payer: Medicare Other | Attending: Internal Medicine | Admitting: Physical Therapy

## 2015-05-22 DIAGNOSIS — M25612 Stiffness of left shoulder, not elsewhere classified: Secondary | ICD-10-CM | POA: Insufficient documentation

## 2015-05-22 DIAGNOSIS — M7072 Other bursitis of hip, left hip: Secondary | ICD-10-CM | POA: Insufficient documentation

## 2015-05-22 DIAGNOSIS — M7071 Other bursitis of hip, right hip: Secondary | ICD-10-CM | POA: Diagnosis not present

## 2015-05-22 NOTE — Therapy (Signed)
[place], [place], [address] Phone: [phone]   Fax:  [phone]  Physical Therapy Treatment  Patient Details  Name: Andrea Mckay MRN: [id number] Date of Birth: [birth year] Referring Provider:  Hoyt Koch, *  Encounter Date: 05/22/2015      PT End of Session - 05/22/15 0957    Visit Number 9   Number of Visits 12   Date for PT Re-Evaluation 05/31/15   PT Start Time 0935   PT Stop Time 1017   PT Time Calculation (min) 42 min   Activity Tolerance Patient tolerated treatment well   Behavior During Therapy [facility] for tasks assessed/performed      Past Medical History  Diagnosis Date  . Myalgia and myositis, unspecified   . Cancer 01/2006    melanoma  . Thyroid disease     hypothyroid  . Postmenopausal   . Goiter   . Fibromyalgia   . Kidney stone   . Allergy   . Arthritis     osteoarthritis  . Diabetes mellitus     diet controlled  . Hyperlipidemia   . Osteoporosis     osteopenia  . Heart murmur   . Foreign body     foot  . Diarrhea   . Candidiasis     vagina/vulva  . Tick bite   . Malignant melanoma   . S/P colonoscopy   . Coronary artery disease     Past Surgical History  Procedure Laterality Date  . Thyroidectomy  1982  . Abdominal hysterectomy  1987  . Knee arthroscopy w/ acl reconstruction    . Foot tendon surgery  1982    Left  . Ovarian cyst removal  1999    removed b/l   . Lipoma excision  01/20/2011    There were no vitals filed for this visit.  Visit Diagnosis:  Bursitis of hip, left  Hip bursitis, right  Stiffness of joint, shoulder region, left      Subjective Assessment - 05/22/15 0941    Subjective I have 20% left to go in my L UE.  Pt had PT last year and would like to have it looked at.  She has alot of pain in hips today, she did alot of work yesterday.     Currently in Pain? Yes   Pain Score 2    Pain Location Groin  Rt. hip 2/10   Pain Orientation Right;Left   Pain Descriptors / Indicators Aching   Pain Type Chronic pain   Pain Onset More than a month ago   Multiple Pain Sites Yes   Pain Score 0   Pain Location Shoulder   Pain Onset More than a month ago   Effect of Pain on Daily Activities stiffness in shoulder can't scratch back (reaching behind back), L shoulder wont go down when I lay flat.             [facility] PT Assessment - 05/22/15 0944    Assessment   Medical Diagnosis L SJ pain  (shoulder) and hips   Prior Therapy yes   Restrictions   Weight Bearing Restrictions No   Balance Screen   Has the patient fallen in the past 6 months No   [place] residence   Prior Function   Level of Independence Independent   Cognition   Overall Cognitive Status Within Functional Limits for tasks assessed   Sensation   Light Touch Not tested  Coordination   Gross Motor Movements are Fluid and Coordinated Not tested   Posture/Postural Control   Posture/Postural Control No significant limitations  L UE more IR and protracted than R   AROM   Right Shoulder Extension 45 Degrees   Right Shoulder Flexion 170 Degrees   Right Shoulder ABduction 148 Degrees   Right Shoulder Internal Rotation 70 Degrees  reach to mid back    Right Shoulder External Rotation 35 Degrees  equal functionally   Left Shoulder Extension 26 Degrees   Left Shoulder Flexion 156 Degrees   Left Shoulder ABduction 98 Degrees   Left Shoulder Internal Rotation 85 Degrees  reach to lumbar   Left Shoulder External Rotation 80 Degrees  equal functionally   Strength   Right/Left Shoulder --  equal in [facility]           [facility] Adult PT Treatment/Exercise - 05/22/15 0944    Lumbar Exercises: Aerobic   Stationary Bike NuStep L4, 64min UE and LE    Knee/Hip Exercises: Sidelying   Hip ABduction Strengthening;Both;1 set;10 reps   Clams x 20 added blue band    Shoulder Exercises: ROM/Strengthening   Other  ROM/Strengthening Exercises supine cane ER x 10, flexion wide grip x 5 and x5 narrow grip   Shoulder Exercises: Stretch   Corner Stretch 3 reps;30 seconds   Internal Rotation Stretch 30 seconds   Internal Rotation Stretch Limitations sleeper stretch    Other Shoulder Stretches IR towel stretch, painful but tolerable            PT Education - 05/22/15 1018    Education provided Yes   Education Details L shoulder HEP and prognosis   Person(s) Educated Patient   Methods Explanation;Handout   Comprehension Verbalized understanding;Returned demonstration          PT Short Term Goals - 05/14/15 1034    PT SHORT TERM GOAL #1   Title Pt. will be I with initial HEP   Status Achieved   PT SHORT TERM GOAL #2   Title Pt. will demonstrate and verbalize condition management sich as RICE and positioning   Status Achieved           PT Long Term Goals - 05/22/15 0953    PT LONG TERM GOAL #1   Title Pt. will be I with advanced HEP   Status On-going   PT LONG TERM GOAL #2   Title Pt. will demo improved ability to  lift rt. leg in prone (hip extension) with ease    Status Achieved   PT LONG TERM GOAL #3   Title Pt. will report decreased hip pain from 8/10 to 3/10 in order to lay on her side in bed to sleep   Status Achieved   PT LONG TERM GOAL #4   Title Pt will report improved ability to stand for one hour without increasing hip pain most of the time   Status On-going   PT LONG TERM GOAL #5   Title Pt will be able to reach to lower back to apply medicine or perform ADLs without pain, with ease   Time 2   Period Weeks   Status New               Plan - 05/22/15 1018    Clinical Impression Statement Focused on eval of L UE and function.  Strength is functional but does lack ROM especially in abd, IR and ER. She was given HEP.  Pt has 2 more visits  and will then be travelling a great deal. Focus on L UE for remaining visits and then DC.     Pt will benefit from skilled  therapeutic intervention in order to improve on the following deficits Decreased range of motion;Pain;Impaired flexibility;Decreased strength;Impaired UE functional use;Increased fascial restricitons   PT Frequency 2x / week   PT Duration 2 weeks   PT Treatment/Interventions Ultrasound;Cryotherapy;Electrical Stimulation;Iontophoresis 4mg /ml Dexamethasone;Moist Heat;Therapeutic exercise;Therapeutic activities;Manual techniques;Patient/family education   PT Next Visit Plan review full HEP, manual to L UE   PT Home Exercise Plan has full   Consulted and Agree with Plan of Care Patient        Problem List Patient Active Problem List   Diagnosis Date Noted  . Edema 04/17/2015  . Vitamin D deficiency 04/17/2015  . Routine general medical examination at a health care facility 09/27/2014  . Obesity (BMI 30.0-34.9) 09/27/2014  . OSA (obstructive sleep apnea) 09/02/2014  . 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 Hyperlipidemia 04/28/2011  . Insomnia 02/09/2008  . Hypothyroidism 12/15/2006  . Fibromyalgia 12/15/2006  . RENAL CALCULUS, HX OF 12/15/2006    PAA,JENNIFER 05/22/2015, 1:48 PM  [facility] [address] [place], [place], [address] Phone: [phone]   Fax:  [phone]   Raeford Razor, PT 05/22/2015 1:48 PM Phone: [phone] Fax: [phone]

## 2015-05-22 NOTE — Telephone Encounter (Signed)
Please advise 

## 2015-05-29 ENCOUNTER — Ambulatory Visit: Payer: Medicare Other | Admitting: Physical Therapy

## 2015-05-29 DIAGNOSIS — M7071 Other bursitis of hip, right hip: Secondary | ICD-10-CM

## 2015-05-29 DIAGNOSIS — M7072 Other bursitis of hip, left hip: Secondary | ICD-10-CM

## 2015-05-29 DIAGNOSIS — M25612 Stiffness of left shoulder, not elsewhere classified: Secondary | ICD-10-CM

## 2015-05-29 NOTE — Therapy (Signed)
Andrea Mckay, [place], [address] Phone: [phone]   Fax:  [phone]  Physical Therapy Treatment  Patient Details  Name: Andrea Mckay MRN: [id number] Date of Birth: [birth year] Referring Provider:  Hoyt Koch, *  Encounter Date: 05/29/2015      PT End of Session - 05/29/15 1012    Visit Number 10   Number of Visits 12   Date for PT Re-Evaluation 05/31/15   PT Start Time 0930   PT Stop Time 1016   PT Time Calculation (min) 46 min   Activity Tolerance Patient tolerated treatment well   Behavior During Therapy [facility] for tasks assessed/performed      Past Medical History  Diagnosis Date  . Myalgia and myositis, unspecified   . Cancer 01/2006    melanoma  . Thyroid disease     hypothyroid  . Postmenopausal   . Goiter   . Fibromyalgia   . Kidney stone   . Allergy   . Arthritis     osteoarthritis  . Diabetes mellitus     diet controlled  . Hyperlipidemia   . Osteoporosis     osteopenia  . Heart murmur   . Foreign body     foot  . Diarrhea   . Candidiasis     vagina/vulva  . Tick bite   . Malignant melanoma   . S/P colonoscopy   . Coronary artery disease     Past Surgical History  Procedure Laterality Date  . Thyroidectomy  1982  . Abdominal hysterectomy  1987  . Knee arthroscopy w/ acl reconstruction    . Foot tendon surgery  1982    Left  . Ovarian cyst removal  1999    removed b/l   . Lipoma excision  01/20/2011    There were no vitals filed for this visit.  Visit Diagnosis:  Bursitis of hip, left  Hip bursitis, right  Stiffness of joint, shoulder region, left      Subjective Assessment - 05/29/15 0939    Subjective I went to a 5 day festival and I was really hurting the 4th day. My lower body was really painful, knees, hips, back, groin.  THe pain was so intense I couldnt move.    Currently in Pain? Yes   Pain Score 2   and tailbone was hurting too     Pain Location Hip   Pain Orientation Left;Right   Pain Type Chronic pain            [facility] Adult PT Treatment/Exercise - 05/29/15 0943    Lumbar Exercises: Stretches   Pelvic Tilt 5 reps;10 seconds   Lumbar Exercises: Aerobic   Stationary Bike Nustep L 5 UE and LE    Lumbar Exercises: Supine   Ab Set 10 reps   Clam --   Clam Limitations inc groin pain    Bridge 10 reps   Other Supine Lumbar Exercises hip circles with black band to assist   Lumbar Exercises: Quadruped   Madcat/Old Horse 5 reps   Opposite Arm/Leg Raise Right arm/Left leg;Left arm/Right leg;5 reps   Other Quadruped Lumbar Exercises childs pose    Knee/Hip Exercises: Stretches   Active Hamstring Stretch 3 reps;30 seconds   ITB Stretch Both;1 rep;30 seconds   Piriformis Stretch Both;5 reps   Knee/Hip Exercises: Sidelying   Clams x 20   Other Sidelying Knee/Hip Exercises asked patient to not do in supine due to strain on groin (bilateral) causes  pain.    Moist Heat Therapy   Number Minutes Moist Heat 20 Minutes   Moist Heat Location Lumbar Spine  while stretching           PT Short Term Goals - 05/14/15 1034    PT SHORT TERM GOAL #1   Title Pt. will be I with initial HEP   Status Achieved   PT SHORT TERM GOAL #2   Title Pt. will demonstrate and verbalize condition management sich as RICE and positioning   Status Achieved           PT Long Term Goals - 05/22/15 0953    PT LONG TERM GOAL #1   Title Pt. will be I with advanced HEP   Status On-going   PT LONG TERM GOAL #2   Title Pt. will demo improved ability to  lift rt. leg in prone (hip extension) with ease    Status Achieved   PT LONG TERM GOAL #3   Title Pt. will report decreased hip pain from 8/10 to 3/10 in order to lay on her side in bed to sleep   Status Achieved   PT LONG TERM GOAL #4   Title Pt will report improved ability to stand for one hour without increasing hip pain most of the time   Status On-going   PT LONG TERM GOAL #5    Title Pt will be able to reach to lower back to apply medicine or perform ADLs without pain, with ease   Time 2   Period Weeks   Status New               Plan - 2015-06-03 1013    Clinical Impression Statement Patient requested work on hips, lower body.  She had no pain after session.  Complains of intense pain with palpation to quads, lateral thigh/ITB.  Also sore in ribcage today.  She has many questions about qwhat is causing her pain.  I feel that becasue she is so sore to the touch, it is likely fibromyalgia and her pain over the weekend was arthritis/prolonged standing.  She had a pos Scour test on L.t hip, Rt. neg.    PT Next Visit Plan review full HEP, manual to L UE, DC and [place] has full          G-Codes - 03-Jun-2015 1015    Functional Assessment Tool Used clin judgement   Functional Limitation Changing and maintaining body position   Changing and Maintaining Body Position Current Status (M3536) At least 20 percent but less than 40 percent impaired, limited or restricted   Changing and Maintaining Body Position Goal Status (R4431) At least 1 percent but less than 20 percent impaired, limited or restricted      Problem List Patient Active Problem List   Diagnosis Date Noted  . Edema 04/17/2015  . Vitamin D deficiency 04/17/2015  . Routine general medical examination at a health care facility 09/27/2014  . Obesity (BMI 30.0-34.9) 09/27/2014  . OSA (obstructive sleep apnea) 09/02/2014  . 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 Hyperlipidemia 04/28/2011  . Insomnia 02/09/2008  . Hypothyroidism 12/15/2006  . Fibromyalgia 12/15/2006  . RENAL CALCULUS, HX OF 12/15/2006    Andrea Mckay June 03, 2015, 10:21 AM  [facility] [address] [place], [place], [address] Phone: [phone]   Fax:  [phone] Raeford Razor, PT 06/03/15 10:21 AM Phone: [phone] Fax: [phone] Raeford Razor,  PT 06/03/15 10:21 AM Phone: [phone]  Fax: [phone]

## 2015-05-31 ENCOUNTER — Ambulatory Visit: Payer: Medicare Other | Admitting: Physical Therapy

## 2015-05-31 DIAGNOSIS — D485 Neoplasm of uncertain behavior of skin: Secondary | ICD-10-CM | POA: Diagnosis not present

## 2015-05-31 DIAGNOSIS — M7072 Other bursitis of hip, left hip: Secondary | ICD-10-CM

## 2015-05-31 DIAGNOSIS — L570 Actinic keratosis: Secondary | ICD-10-CM | POA: Diagnosis not present

## 2015-05-31 DIAGNOSIS — L905 Scar conditions and fibrosis of skin: Secondary | ICD-10-CM | POA: Diagnosis not present

## 2015-05-31 DIAGNOSIS — M7071 Other bursitis of hip, right hip: Secondary | ICD-10-CM

## 2015-05-31 DIAGNOSIS — M25612 Stiffness of left shoulder, not elsewhere classified: Secondary | ICD-10-CM

## 2015-05-31 NOTE — Patient Instructions (Addendum)
   PELVIC TILT  Lie on back, legs bent. Exhale, tilting top of pelvis back, pubic bone up, to flatten lower back. Inhale, rolling pelvis opposite way, top forward, pubic bone down, arch in back. Repeat __10__ times. Do __2__ sessions per day. Copyright  VHI. All rights reserved.    Isometric Hold With Pelvic Floor (Hook-Lying)  Lie with hips and knees bent. Slowly inhale, and then exhale. Pull navel toward spine and tighten pelvic floor. Hold for __10_ seconds. Continue to breathe in and out during hold. Rest for _10__ seconds. Repeat __10_ times. Do __2-3_ times a day.   Knee Fold  Lie on back, legs bent, arms by sides. Exhale, lifting knee to chest. Inhale, returning. Keep abdominals flat, navel to spine. Repeat __10__ times, alternating legs. Do __2__ sessions per day. Heel Slide to Straight   Slide one leg down to straight. Return. Be sure pelvis does not rock forward, tilt, rotate, or tip to side. Do _10__ times. Restabilize pelvis. Repeat with other leg. Do __1-2_ sets, __2_ times per day.     Bracing With Arm / Leg Raise (Quadruped)   On hands and knees find neutral spine. Tighten pelvic floor and abdominals and hold. Alternating, lift arm to shoulder level and opposite leg to hip level. HOLD5-10 sec each. Repeat __10_ times. Do _2__ times a day.   Scissors Level 2 & 3  issued from drawer, 10 reps 2 sets, 2 x per day

## 2015-05-31 NOTE — Therapy (Signed)
[place], [place], [address] Phone: [phone]   Fax:  [phone]  Physical Therapy Treatment  Patient Details  Name: Andrea Mckay MRN: [id number] Date of Birth: [birth year] Referring Provider:  Hoyt Koch, *  Encounter Date: 05/31/2015      PT End of Session - 05/31/15 1548    Visit Number 11   Number of Visits 12   Date for PT Re-Evaluation 05/31/15   PT Start Time 0302   PT Stop Time 0340   PT Time Calculation (min) 38 min      Past Medical History  Diagnosis Date  . Myalgia and myositis, unspecified   . Cancer 01/2006    melanoma  . Thyroid disease     hypothyroid  . Postmenopausal   . Goiter   . Fibromyalgia   . Kidney stone   . Allergy   . Arthritis     osteoarthritis  . Diabetes mellitus     diet controlled  . Hyperlipidemia   . Osteoporosis     osteopenia  . Heart murmur   . Foreign body     foot  . Diarrhea   . Candidiasis     vagina/vulva  . Tick bite   . Malignant melanoma   . S/P colonoscopy   . Coronary artery disease     Past Surgical History  Procedure Laterality Date  . Thyroidectomy  1982  . Abdominal hysterectomy  1987  . Knee arthroscopy w/ acl reconstruction    . Foot tendon surgery  1982    Left  . Ovarian cyst removal  1999    removed b/l   . Lipoma excision  01/20/2011    There were no vitals filed for this visit.  Visit Diagnosis:  Bursitis of hip, left  Hip bursitis, right  Stiffness of joint, shoulder region, left        [facility] Adult PT Treatment/Exercise - 05/31/15 1507    Lumbar Exercises: Stretches   Pelvic Tilt 5 reps;10 seconds   Piriformis Stretch 2 reps;30 seconds   Lumbar Exercises: Supine   Ab Set 10 reps   Heel Slides 10 reps   Straight Leg Raise 10 reps   Other Supine Lumbar Exercises Abdominal crunches, hundreds- tried table top.    Lumbar Exercises: Quadruped   Madcat/Old Horse 5 reps   Opposite Arm/Leg  Raise Right arm/Left leg;Left arm/Right leg;5 reps   Other Quadruped Lumbar Exercises childs pose    Knee/Hip Exercises: Sidelying   Clams x 20  bilateral                PT Education - 05/31/15 1549    Education provided Yes   Education Details Qped Alt UE/LE, scissors, pre HEP, Pilates mat class   Person(s) Educated Patient   Methods Explanation;Handout   Comprehension Verbalized understanding          PT Short Term Goals - 05/14/15 1034    PT SHORT TERM GOAL #1   Title Pt. will be I with initial HEP   Status Achieved   PT SHORT TERM GOAL #2   Title Pt. will demonstrate and verbalize condition management sich as RICE and positioning   Status Achieved           PT Long Term Goals - 05/31/15 1531    PT LONG TERM GOAL #1   Title Pt. will be I with advanced HEP   Time 6   Period Weeks  Status Achieved   PT LONG TERM GOAL #2   Title Pt. will demo improved ability to  lift rt. leg in prone (hip extension) with ease    Time 6   Period Weeks   Status Achieved   PT LONG TERM GOAL #3   Title Pt. will report decreased hip pain from 8/10 to 3/10 in order to lay on her side in bed to sleep   Time 6   Period Weeks   Status Achieved   PT LONG TERM GOAL #4   Title Pt will report improved ability to stand for one hour without increasing hip pain most of the time   Time 6   Status Achieved   PT LONG TERM GOAL #5   Title Pt will be able to reach to lower back to apply medicine or perform ADLs without pain, with ease   Time 2   Period Weeks   Status Not Met               Plan - 2015-06-27 1544    Clinical Impression Statement Pt reports improved ability to stand. She stood for hours at a music festival without hip pain but did have fibromyalgia pain. She reports no pain over last couple of days in her hips unitl she carried heavy things to her upstairs yesterday, Today her pain is 7/10. She was given print outs to include the exercises from last visit and they  were reviewed. She was given info about our Pilates Mat class. She did not want to review her entire HEP and stated she has not been working on the shoulder HEP. She had many questions about if she could gain more IR/ER ROM. I told her we could try but we cannot predict her improvement. She reports already having 3 months of PT for ROM earlier this year. She may consider trying again after the holidays. Most goals met.    PT Next Visit Plan Discharge today          G-Codes - 06/27/15 0805    Functional Assessment Tool Used clinical judgement   Functional Limitation Changing and maintaining body position   Changing and Maintaining Body Position Current Status [phone]) At least 20 percent but less than 40 percent impaired, limited or restricted   Changing and Maintaining Body Position Goal Status (V6153) At least 1 percent but less than 20 percent impaired, limited or restricted   Changing and Maintaining Body Position Discharge Status (P9432) At least 20 percent but less than 40 percent impaired, limited or restricted      Problem List Patient Active Problem List   Diagnosis Date Noted  . Edema 04/17/2015  . Vitamin D deficiency 04/17/2015  . Routine general medical examination at a health care facility 09/27/2014  . Obesity (BMI 30.0-34.9) 09/27/2014  . OSA (obstructive sleep apnea) 09/02/2014  . 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 Hyperlipidemia 04/28/2011  . Insomnia 02/09/2008  . Hypothyroidism 12/15/2006  . Fibromyalgia 12/15/2006  . RENAL CALCULUS, HX OF 12/15/2006    PAA,JENNIFER, PTA 06/01/2015, 8:25 AM  [facility] [address] [place], [place], [address] Phone: [phone]   Fax:  [phone]     PHYSICAL THERAPY DISCHARGE SUMMARY  Visits from Start of Care: 11  Current functional level related to goals / functional outcomes: See above for goals met   Remaining deficits: Pain, mild core and hip weakness, UE decr  ROM   Education / Equipment: HEP, Pilates, IC vs heat, posture  and body mech Plan: Patient agrees to discharge.  Patient goals were partially met. Patient is being discharged due to meeting the stated rehab goals.  ?????   Plateau of progress. Pt will travelling a lot over the next 2 months.   Raeford Razor, PT 06/01/2015 8:25 AM Phone: [phone] Fax: [phone]

## 2015-07-16 ENCOUNTER — Encounter: Payer: Self-pay | Admitting: Internal Medicine

## 2015-07-24 ENCOUNTER — Other Ambulatory Visit (INDEPENDENT_AMBULATORY_CARE_PROVIDER_SITE_OTHER): Payer: Medicare Other

## 2015-07-24 ENCOUNTER — Encounter: Payer: Self-pay | Admitting: Internal Medicine

## 2015-07-24 ENCOUNTER — Ambulatory Visit (INDEPENDENT_AMBULATORY_CARE_PROVIDER_SITE_OTHER): Payer: Medicare Other | Admitting: Internal Medicine

## 2015-07-24 VITALS — BP 138/72 | HR 74 | Temp 98.5°F | Resp 14 | Ht 65.5 in | Wt 200.0 lb

## 2015-07-24 DIAGNOSIS — E039 Hypothyroidism, unspecified: Secondary | ICD-10-CM | POA: Diagnosis not present

## 2015-07-24 DIAGNOSIS — E559 Vitamin D deficiency, unspecified: Secondary | ICD-10-CM | POA: Diagnosis not present

## 2015-07-24 DIAGNOSIS — R109 Unspecified abdominal pain: Secondary | ICD-10-CM

## 2015-07-24 DIAGNOSIS — R7301 Impaired fasting glucose: Secondary | ICD-10-CM | POA: Diagnosis not present

## 2015-07-24 DIAGNOSIS — Z8739 Personal history of other diseases of the musculoskeletal system and connective tissue: Secondary | ICD-10-CM

## 2015-07-24 DIAGNOSIS — R1013 Epigastric pain: Secondary | ICD-10-CM | POA: Insufficient documentation

## 2015-07-24 DIAGNOSIS — Z8639 Personal history of other endocrine, nutritional and metabolic disease: Secondary | ICD-10-CM

## 2015-07-24 LAB — COMPREHENSIVE METABOLIC PANEL
ALK PHOS: 123 U/L — AB (ref 39–117)
ALT: 26 U/L (ref 0–35)
AST: 21 U/L (ref 0–37)
Albumin: 4 g/dL (ref 3.5–5.2)
BILIRUBIN TOTAL: 0.3 mg/dL (ref 0.2–1.2)
BUN: 15 mg/dL (ref 6–23)
CO2: 28 mEq/L (ref 19–32)
Calcium: 9.2 mg/dL (ref 8.4–10.5)
Chloride: 104 mEq/L (ref 96–112)
Creatinine, Ser: 0.79 mg/dL (ref 0.40–1.20)
GFR: 76.43 mL/min (ref >60.00)
GLUCOSE: 101 mg/dL — AB (ref 70–99)
Potassium: 4.2 mEq/L (ref 3.5–5.1)
SODIUM: 140 meq/L (ref 135–145)
TOTAL PROTEIN: 6.9 g/dL (ref 6.0–8.3)

## 2015-07-24 LAB — TSH: TSH: 0.29 u[IU]/mL — AB (ref 0.35–4.50)

## 2015-07-24 LAB — LIPASE: Lipase: 22 U/L (ref 11.0–59.0)

## 2015-07-24 LAB — T3, FREE: T3, Free: 3.9 pg/mL (ref 2.3–4.2)

## 2015-07-24 LAB — HEMOGLOBIN A1C: Hgb A1c MFr Bld: 6.3 % (ref 4.6–6.5)

## 2015-07-24 LAB — VITAMIN D 25 HYDROXY (VIT D DEFICIENCY, FRACTURES): VITD: 53.54 ng/mL (ref 30.00–100.00)

## 2015-07-24 LAB — T4, FREE: Free T4: 0.71 ng/dL (ref 0.60–1.60)

## 2015-07-24 LAB — URIC ACID: Uric Acid, Serum: 5.2 mg/dL (ref 2.4–7.0)

## 2015-07-24 NOTE — Progress Notes (Signed)
   Subjective:    Patient ID: Andrea Mckay, female    DOB: 12/14/1945, 69 y.o.   MRN: [id number]  HPI The patient is a 69 YO female coming in with several concerns. Firstly she had some stomach pain last night around 2AM which woke her. It lasted about 5 minutes and then left. It was so severe she was unable to move. NO position was comfortable. Denies eating late at night or change in diet. No increased alcohol intake. No GERD symptoms previously. 1 prior episode of the same. See A/P for status and treatment of other problems. Accompanied by mild nausea but no sweating or vomiting. Denies diarrhea or constipation.   Review of Systems  Constitutional: Positive for fatigue. Negative for fever, activity change, appetite change and unexpected weight change.  Respiratory: Negative for cough, chest tightness, shortness of breath and wheezing.   Cardiovascular: Negative for chest pain, palpitations and leg swelling.  Gastrointestinal: Positive for nausea and abdominal pain. Negative for diarrhea, constipation and abdominal distention.  Musculoskeletal: Positive for myalgias. Negative for back pain and arthralgias.  Skin: Positive for rash.  Neurological: Negative.   Psychiatric/Behavioral: Negative.       Objective:   Physical Exam  Constitutional: She is oriented to person, place, and time. She appears well-developed and well-nourished.  Overweight  HENT:  Head: Normocephalic and atraumatic.  Eyes: EOM are normal.  Neck: Normal range of motion.  Cardiovascular: Normal rate and regular rhythm.   Pulmonary/Chest: Effort normal and breath sounds normal. No respiratory distress. She has no wheezes. She has no rales.  Abdominal: Soft. Bowel sounds are normal. She exhibits no distension. There is no tenderness. There is no rebound.  Musculoskeletal: She exhibits no edema.  Neurological: She is alert and oriented to person, place, and time.  Skin: Skin is warm and dry.   Filed Vitals:   07/24/15 1459  BP: 138/72  Pulse: 74  Temp: 98.5 F (36.9 C)  TempSrc: Oral  Resp: 14  Height: 5' 5.5" (1.664 m)  Weight: 200 lb (90.719 kg)  SpO2: 97%      Assessment & Plan:

## 2015-07-24 NOTE — Assessment & Plan Note (Signed)
Could be GERD versus gallbladder. Checking labs today for kidney, liver, lipase. If no etiology will check [place] stomach as the pain was so severe. No change in diet or eating late at night to suggest GERD (eats last meal at 5:30PM).

## 2015-07-24 NOTE — Assessment & Plan Note (Signed)
Checking vitamin D level now that she is on supplementation.

## 2015-07-24 NOTE — Patient Instructions (Signed)
We are checking the labs today and will get in touch with you about the results.   If we do not find a good cause then we will get the ultrasound of the stomach.

## 2015-07-24 NOTE — Assessment & Plan Note (Signed)
Checking TSH and free T3 and T4 today as dose adjustment was made at last visit.

## 2015-07-24 NOTE — Progress Notes (Signed)
Pre visit review using our clinic review tool, if applicable. No additional management support is needed unless otherwise documented below in the visit note. 

## 2015-07-31 ENCOUNTER — Encounter: Payer: Self-pay | Admitting: Internal Medicine

## 2015-08-01 ENCOUNTER — Other Ambulatory Visit: Payer: Self-pay

## 2015-08-01 MED ORDER — THYROID 146.25 MG PO TABS: 146.2500 mg | ORAL_TABLET | Freq: Every day | ORAL | Status: DC

## 2015-08-02 DIAGNOSIS — D225 Melanocytic nevi of trunk: Secondary | ICD-10-CM | POA: Diagnosis not present

## 2015-08-02 DIAGNOSIS — D2262 Melanocytic nevi of left upper limb, including shoulder: Secondary | ICD-10-CM | POA: Diagnosis not present

## 2015-08-02 DIAGNOSIS — L910 Hypertrophic scar: Secondary | ICD-10-CM | POA: Diagnosis not present

## 2015-08-02 DIAGNOSIS — D1801 Hemangioma of skin and subcutaneous tissue: Secondary | ICD-10-CM | POA: Diagnosis not present

## 2015-08-02 DIAGNOSIS — L821 Other seborrheic keratosis: Secondary | ICD-10-CM | POA: Diagnosis not present

## 2015-08-02 DIAGNOSIS — L905 Scar conditions and fibrosis of skin: Secondary | ICD-10-CM | POA: Diagnosis not present

## 2015-08-02 DIAGNOSIS — D2261 Melanocytic nevi of right upper limb, including shoulder: Secondary | ICD-10-CM | POA: Diagnosis not present

## 2015-08-02 DIAGNOSIS — L570 Actinic keratosis: Secondary | ICD-10-CM | POA: Diagnosis not present

## 2015-08-10 DIAGNOSIS — T781XXA Other adverse food reactions, not elsewhere classified, initial encounter: Secondary | ICD-10-CM | POA: Diagnosis not present

## 2015-08-10 DIAGNOSIS — J3089 Other allergic rhinitis: Secondary | ICD-10-CM | POA: Diagnosis not present

## 2015-08-10 DIAGNOSIS — R21 Rash and other nonspecific skin eruption: Secondary | ICD-10-CM | POA: Diagnosis not present

## 2015-08-10 DIAGNOSIS — J310 Chronic rhinitis: Secondary | ICD-10-CM | POA: Diagnosis not present

## 2015-09-05 ENCOUNTER — Encounter: Payer: Self-pay | Admitting: Internal Medicine

## 2015-10-19 ENCOUNTER — Ambulatory Visit (INDEPENDENT_AMBULATORY_CARE_PROVIDER_SITE_OTHER): Payer: Medicare Other | Admitting: Internal Medicine

## 2015-10-19 ENCOUNTER — Other Ambulatory Visit (INDEPENDENT_AMBULATORY_CARE_PROVIDER_SITE_OTHER): Payer: Medicare Other

## 2015-10-19 ENCOUNTER — Encounter: Payer: Self-pay | Admitting: Internal Medicine

## 2015-10-19 VITALS — BP 130/82 | HR 76 | Temp 98.0°F | Resp 20

## 2015-10-19 DIAGNOSIS — G4709 Other insomnia: Secondary | ICD-10-CM | POA: Diagnosis not present

## 2015-10-19 DIAGNOSIS — M160 Bilateral primary osteoarthritis of hip: Secondary | ICD-10-CM | POA: Diagnosis not present

## 2015-10-19 DIAGNOSIS — M533 Sacrococcygeal disorders, not elsewhere classified: Secondary | ICD-10-CM | POA: Diagnosis not present

## 2015-10-19 DIAGNOSIS — M797 Fibromyalgia: Secondary | ICD-10-CM

## 2015-10-19 DIAGNOSIS — E039 Hypothyroidism, unspecified: Secondary | ICD-10-CM

## 2015-10-19 LAB — TSH: TSH: 0.46 u[IU]/mL (ref 0.35–4.50)

## 2015-10-19 LAB — T4, FREE: Free T4: 0.71 ng/dL (ref 0.60–1.60)

## 2015-10-19 NOTE — Patient Instructions (Signed)
We are checking the thyroid labs today and will call you back about the results.

## 2015-10-19 NOTE — Assessment & Plan Note (Signed)
Checking TSH and free T4 and adjust as needed.  

## 2015-10-19 NOTE — Assessment & Plan Note (Signed)
Recent flare with doing the plank exercises which she was able to work through with aspirin and continuing the exercises and stretching.

## 2015-10-19 NOTE — Progress Notes (Signed)
Pre visit review using our clinic review tool, if applicable. No additional management support is needed unless otherwise documented below in the visit note. 

## 2015-10-19 NOTE — Progress Notes (Signed)
   Subjective:    Patient ID: Andrea Mckay, female    DOB: 10-22-1945, 70 y.o.   MRN: [id number]  HPI The patient is a 70 YO female coming in for abdominal pain after doing some plank exercise. She felt something pull then intense pain. She used 2 aspirin for the next 3-4 days and now the pain is minimal. She feels like she has some abdominal bloating but no bulging with coughing or sneezing or laughing.   Review of Systems  Constitutional: Positive for fatigue. Negative for fever, activity change, appetite change and unexpected weight change.  Respiratory: Negative for cough, chest tightness, shortness of breath and wheezing.   Cardiovascular: Negative for chest pain, palpitations and leg swelling.  Gastrointestinal: Positive for nausea and abdominal pain. Negative for diarrhea, constipation and abdominal distention.  Musculoskeletal: Positive for myalgias. Negative for back pain and arthralgias.  Skin: Positive for rash.  Neurological: Negative.   Psychiatric/Behavioral: Negative.       Objective:   Physical Exam  Constitutional: She is oriented to person, place, and time. She appears well-developed and well-nourished.  Overweight  HENT:  Head: Normocephalic and atraumatic.  Eyes: EOM are normal.  Neck: Normal range of motion.  Cardiovascular: Normal rate and regular rhythm.   Pulmonary/Chest: Effort normal and breath sounds normal. No respiratory distress. She has no wheezes. She has no rales.  Abdominal: Soft. Bowel sounds are normal. She exhibits no distension. There is no tenderness. There is no rebound.  Musculoskeletal: She exhibits no edema.  Neurological: She is alert and oriented to person, place, and time.  Skin: Skin is warm and dry.   Filed Vitals:   10/19/15 1315  BP: 130/82  Pulse: 76  Temp: 98 F (36.7 C)  TempSrc: Oral  Resp: 20  SpO2: 94%      Assessment & Plan:

## 2015-10-22 ENCOUNTER — Encounter: Payer: Self-pay | Admitting: Internal Medicine

## 2015-10-22 ENCOUNTER — Other Ambulatory Visit: Payer: Self-pay | Admitting: Geriatric Medicine

## 2015-10-22 DIAGNOSIS — Z0000 Encounter for general adult medical examination without abnormal findings: Secondary | ICD-10-CM | POA: Diagnosis not present

## 2015-10-22 DIAGNOSIS — N3944 Nocturnal enuresis: Secondary | ICD-10-CM | POA: Diagnosis not present

## 2015-10-22 MED ORDER — THYROID 146.25 MG PO TABS: 146.2500 mg | ORAL_TABLET | Freq: Every day | ORAL | Status: DC

## 2015-10-29 ENCOUNTER — Telehealth: Payer: Self-pay | Admitting: Geriatric Medicine

## 2015-10-29 DIAGNOSIS — R278 Other lack of coordination: Secondary | ICD-10-CM | POA: Diagnosis not present

## 2015-10-29 DIAGNOSIS — M545 Low back pain: Secondary | ICD-10-CM | POA: Diagnosis not present

## 2015-10-29 DIAGNOSIS — M62838 Other muscle spasm: Secondary | ICD-10-CM | POA: Diagnosis not present

## 2015-10-29 DIAGNOSIS — M6281 Muscle weakness (generalized): Secondary | ICD-10-CM | POA: Diagnosis not present

## 2015-10-29 DIAGNOSIS — M533 Sacrococcygeal disorders, not elsewhere classified: Secondary | ICD-10-CM | POA: Diagnosis not present

## 2015-10-29 MED ORDER — THYROID 146.25 MG PO TABS: 146.2500 mg | ORAL_TABLET | ORAL | Status: DC

## 2015-10-29 MED ORDER — THYROID 130 MG PO TABS: 130.0000 mg | ORAL_TABLET | ORAL | Status: DC

## 2015-10-29 NOTE — Telephone Encounter (Signed)
Patient and pharmacy aware  

## 2015-10-29 NOTE — Addendum Note (Signed)
Addended by: Pricilla Holm A on: 10/29/2015 12:18 PM   Modules accepted: Orders

## 2015-10-29 NOTE — Telephone Encounter (Signed)
This is all we have prescribed for her in the past but we will send in the nature thyroid 130 mg.

## 2015-10-29 NOTE — Telephone Encounter (Signed)
Patient said she had been on the 146.25 mg daily in the past and it caused her to have chest pains and raise her AST. She wants [place] to send in the same thing she has taken in the past. She said it is not confusing to her at all and she does not mind taking two different strengths.

## 2015-10-29 NOTE — Telephone Encounter (Signed)
Aaron Edelman from the pharmacy called and we sent in Thyroid 146.25 take daily. She used to alternate 146.25 mg every other day with 130 mg. She would like to keep doing this and not alternate. We need to send in an rx for both strengths per the pharmacist. Please advise, thanks.

## 2015-10-29 NOTE — Telephone Encounter (Signed)
She should not be alternating and we sent in the correct rx.

## 2015-11-06 ENCOUNTER — Encounter: Payer: Self-pay | Admitting: Internal Medicine

## 2015-11-06 DIAGNOSIS — R278 Other lack of coordination: Secondary | ICD-10-CM | POA: Diagnosis not present

## 2015-11-06 DIAGNOSIS — M62838 Other muscle spasm: Secondary | ICD-10-CM | POA: Diagnosis not present

## 2015-11-06 DIAGNOSIS — M545 Low back pain: Secondary | ICD-10-CM | POA: Diagnosis not present

## 2015-11-06 DIAGNOSIS — M6281 Muscle weakness (generalized): Secondary | ICD-10-CM | POA: Diagnosis not present

## 2015-11-06 DIAGNOSIS — M533 Sacrococcygeal disorders, not elsewhere classified: Secondary | ICD-10-CM | POA: Diagnosis not present

## 2015-11-09 DIAGNOSIS — T781XXD Other adverse food reactions, not elsewhere classified, subsequent encounter: Secondary | ICD-10-CM | POA: Diagnosis not present

## 2015-11-09 DIAGNOSIS — R21 Rash and other nonspecific skin eruption: Secondary | ICD-10-CM | POA: Diagnosis not present

## 2015-11-09 DIAGNOSIS — J310 Chronic rhinitis: Secondary | ICD-10-CM | POA: Diagnosis not present

## 2015-11-15 DIAGNOSIS — M545 Low back pain: Secondary | ICD-10-CM | POA: Diagnosis not present

## 2015-11-15 DIAGNOSIS — M533 Sacrococcygeal disorders, not elsewhere classified: Secondary | ICD-10-CM | POA: Diagnosis not present

## 2015-11-15 DIAGNOSIS — M6281 Muscle weakness (generalized): Secondary | ICD-10-CM | POA: Diagnosis not present

## 2015-11-15 DIAGNOSIS — R278 Other lack of coordination: Secondary | ICD-10-CM | POA: Diagnosis not present

## 2015-11-15 DIAGNOSIS — M62838 Other muscle spasm: Secondary | ICD-10-CM | POA: Diagnosis not present

## 2015-11-19 DIAGNOSIS — E7800 Pure hypercholesterolemia, unspecified: Secondary | ICD-10-CM | POA: Diagnosis not present

## 2015-11-19 DIAGNOSIS — R7302 Impaired glucose tolerance (oral): Secondary | ICD-10-CM | POA: Diagnosis not present

## 2015-11-19 DIAGNOSIS — E890 Postprocedural hypothyroidism: Secondary | ICD-10-CM | POA: Diagnosis not present

## 2015-11-20 DIAGNOSIS — R278 Other lack of coordination: Secondary | ICD-10-CM | POA: Diagnosis not present

## 2015-11-20 DIAGNOSIS — M533 Sacrococcygeal disorders, not elsewhere classified: Secondary | ICD-10-CM | POA: Diagnosis not present

## 2015-11-20 DIAGNOSIS — M62838 Other muscle spasm: Secondary | ICD-10-CM | POA: Diagnosis not present

## 2015-11-20 DIAGNOSIS — M545 Low back pain: Secondary | ICD-10-CM | POA: Diagnosis not present

## 2015-11-26 DIAGNOSIS — E669 Obesity, unspecified: Secondary | ICD-10-CM | POA: Diagnosis not present

## 2015-11-26 DIAGNOSIS — R7302 Impaired glucose tolerance (oral): Secondary | ICD-10-CM | POA: Diagnosis not present

## 2015-11-26 DIAGNOSIS — E7800 Pure hypercholesterolemia, unspecified: Secondary | ICD-10-CM | POA: Diagnosis not present

## 2015-11-26 DIAGNOSIS — E890 Postprocedural hypothyroidism: Secondary | ICD-10-CM | POA: Diagnosis not present

## 2015-11-27 DIAGNOSIS — M545 Low back pain: Secondary | ICD-10-CM | POA: Diagnosis not present

## 2015-11-27 DIAGNOSIS — M6281 Muscle weakness (generalized): Secondary | ICD-10-CM | POA: Diagnosis not present

## 2015-11-27 DIAGNOSIS — R278 Other lack of coordination: Secondary | ICD-10-CM | POA: Diagnosis not present

## 2015-11-27 DIAGNOSIS — M62838 Other muscle spasm: Secondary | ICD-10-CM | POA: Diagnosis not present

## 2015-11-27 DIAGNOSIS — M533 Sacrococcygeal disorders, not elsewhere classified: Secondary | ICD-10-CM | POA: Diagnosis not present

## 2015-12-04 DIAGNOSIS — Z1231 Encounter for screening mammogram for malignant neoplasm of breast: Secondary | ICD-10-CM | POA: Diagnosis not present

## 2015-12-04 LAB — HM MAMMOGRAPHY

## 2015-12-11 DIAGNOSIS — M545 Low back pain: Secondary | ICD-10-CM | POA: Diagnosis not present

## 2015-12-11 DIAGNOSIS — M6281 Muscle weakness (generalized): Secondary | ICD-10-CM | POA: Diagnosis not present

## 2015-12-11 DIAGNOSIS — M62838 Other muscle spasm: Secondary | ICD-10-CM | POA: Diagnosis not present

## 2015-12-11 DIAGNOSIS — R278 Other lack of coordination: Secondary | ICD-10-CM | POA: Diagnosis not present

## 2015-12-11 DIAGNOSIS — M533 Sacrococcygeal disorders, not elsewhere classified: Secondary | ICD-10-CM | POA: Diagnosis not present

## 2015-12-13 DIAGNOSIS — R928 Other abnormal and inconclusive findings on diagnostic imaging of breast: Secondary | ICD-10-CM | POA: Diagnosis not present

## 2015-12-13 DIAGNOSIS — R922 Inconclusive mammogram: Secondary | ICD-10-CM | POA: Diagnosis not present

## 2015-12-13 LAB — HM MAMMOGRAPHY

## 2015-12-14 DIAGNOSIS — Z0100 Encounter for examination of eyes and vision without abnormal findings: Secondary | ICD-10-CM | POA: Diagnosis not present

## 2015-12-14 DIAGNOSIS — H2513 Age-related nuclear cataract, bilateral: Secondary | ICD-10-CM | POA: Diagnosis not present

## 2015-12-14 DIAGNOSIS — E119 Type 2 diabetes mellitus without complications: Secondary | ICD-10-CM | POA: Diagnosis not present

## 2015-12-17 ENCOUNTER — Encounter: Payer: Self-pay | Admitting: Internal Medicine

## 2015-12-17 DIAGNOSIS — E039 Hypothyroidism, unspecified: Secondary | ICD-10-CM

## 2015-12-18 ENCOUNTER — Encounter: Payer: Self-pay | Admitting: Internal Medicine

## 2015-12-18 ENCOUNTER — Encounter: Payer: Self-pay | Admitting: Gastroenterology

## 2015-12-18 DIAGNOSIS — M533 Sacrococcygeal disorders, not elsewhere classified: Secondary | ICD-10-CM | POA: Diagnosis not present

## 2015-12-18 DIAGNOSIS — M62838 Other muscle spasm: Secondary | ICD-10-CM | POA: Diagnosis not present

## 2015-12-18 DIAGNOSIS — M6281 Muscle weakness (generalized): Secondary | ICD-10-CM | POA: Diagnosis not present

## 2015-12-18 DIAGNOSIS — R278 Other lack of coordination: Secondary | ICD-10-CM | POA: Diagnosis not present

## 2015-12-18 DIAGNOSIS — M545 Low back pain: Secondary | ICD-10-CM | POA: Diagnosis not present

## 2015-12-25 ENCOUNTER — Encounter: Payer: Self-pay | Admitting: Internal Medicine

## 2015-12-28 ENCOUNTER — Encounter: Payer: Self-pay | Admitting: Internal Medicine

## 2016-01-28 ENCOUNTER — Encounter: Payer: Self-pay | Admitting: Internal Medicine

## 2016-01-28 ENCOUNTER — Ambulatory Visit (INDEPENDENT_AMBULATORY_CARE_PROVIDER_SITE_OTHER): Payer: Medicare Other | Admitting: Internal Medicine

## 2016-01-28 VITALS — BP 136/72 | HR 77 | Temp 98.7°F | Resp 16 | Ht 65.5 in | Wt 201.0 lb

## 2016-01-28 DIAGNOSIS — I341 Nonrheumatic mitral (valve) prolapse: Secondary | ICD-10-CM

## 2016-01-28 DIAGNOSIS — R0989 Other specified symptoms and signs involving the circulatory and respiratory systems: Secondary | ICD-10-CM

## 2016-01-28 DIAGNOSIS — R55 Syncope and collapse: Secondary | ICD-10-CM

## 2016-01-28 NOTE — Progress Notes (Signed)
Pre visit review using our clinic review tool, if applicable. No additional management support is needed unless otherwise documented below in the visit note. 

## 2016-01-28 NOTE — Patient Instructions (Signed)
We will get the echo of the heart and the ultrasound of the neck arteries to make sure that the symptoms with exercise are not a change we should know about.

## 2016-01-29 ENCOUNTER — Encounter: Payer: Self-pay | Admitting: Internal Medicine

## 2016-01-29 DIAGNOSIS — R55 Syncope and collapse: Secondary | ICD-10-CM | POA: Insufficient documentation

## 2016-01-29 NOTE — Assessment & Plan Note (Signed)
No echo in some time. Mitral valve problem in the past. Checking 2D echo as well as carotid doppler to rule out stenosis causing these symptoms.

## 2016-01-29 NOTE — Progress Notes (Signed)
   Subjective:    Patient ID: Andrea Mckay, female    DOB: 07-21-1946, 70 y.o.   MRN: [id number]  HPI The patient is a 70 YO female coming in for pre-syncope symptoms while exercising. Started doing curves which is building up endurance. She is able to exercise and partly in she gets to feeling faint and needs to sit down. She admits to drinking enough water before exercise. This has never happened before. She is concerned as she has mitral valve problems in the past and has not been checked in some time.   Review of Systems  Constitutional: Positive for fatigue. Negative for fever, activity change, appetite change and unexpected weight change.  Respiratory: Negative for cough, chest tightness, shortness of breath and wheezing.   Cardiovascular: Negative for chest pain, palpitations and leg swelling.  Gastrointestinal: Negative for diarrhea, constipation and abdominal distention.  Musculoskeletal: Positive for myalgias. Negative for back pain and arthralgias.  Skin: Positive for rash.  Neurological: Positive for dizziness and light-headedness. Negative for syncope and weakness.  Psychiatric/Behavioral: Negative.       Objective:   Physical Exam  Constitutional: She is oriented to person, place, and time. She appears well-developed and well-nourished.  Overweight  HENT:  Head: Normocephalic and atraumatic.  Eyes: EOM are normal.  Neck: Normal range of motion.  Cardiovascular: Normal rate and regular rhythm.   Pulmonary/Chest: Effort normal and breath sounds normal. No respiratory distress. She has no wheezes. She has no rales.  Abdominal: Soft. Bowel sounds are normal. She exhibits no distension. There is no tenderness. There is no rebound.  Musculoskeletal: She exhibits no edema.  Neurological: She is alert and oriented to person, place, and time.  Skin: Skin is warm and dry.   Filed Vitals:   01/28/16 1519  BP: 136/72  Pulse: 77  Temp: 98.7 F (37.1 C)  TempSrc: Oral    Resp: 16  Height: 5' 5.5" (1.664 m)  Weight: 201 lb (91.173 kg)  SpO2: 94%      Assessment & Plan:

## 2016-01-30 ENCOUNTER — Ambulatory Visit (HOSPITAL_COMMUNITY)
Admission: RE | Admit: 2016-01-30 | Discharge: 2016-01-30 | Disposition: A | Discharge status: Home / Self Care | Payer: Medicare Other | Source: Ambulatory Visit | Attending: Internal Medicine | Admitting: Internal Medicine

## 2016-01-30 DIAGNOSIS — E119 Type 2 diabetes mellitus without complications: Secondary | ICD-10-CM | POA: Diagnosis not present

## 2016-01-30 DIAGNOSIS — R0989 Other specified symptoms and signs involving the circulatory and respiratory systems: Secondary | ICD-10-CM

## 2016-01-30 DIAGNOSIS — E785 Hyperlipidemia, unspecified: Secondary | ICD-10-CM | POA: Diagnosis not present

## 2016-01-30 DIAGNOSIS — R55 Syncope and collapse: Secondary | ICD-10-CM | POA: Diagnosis present

## 2016-01-30 DIAGNOSIS — I2510 Atherosclerotic heart disease of native coronary artery without angina pectoris: Secondary | ICD-10-CM | POA: Diagnosis not present

## 2016-02-01 ENCOUNTER — Other Ambulatory Visit: Payer: Self-pay

## 2016-02-01 ENCOUNTER — Ambulatory Visit (HOSPITAL_COMMUNITY): Payer: Medicare Other | Attending: Internal Medicine

## 2016-02-01 DIAGNOSIS — I358 Other nonrheumatic aortic valve disorders: Secondary | ICD-10-CM | POA: Insufficient documentation

## 2016-02-01 DIAGNOSIS — L821 Other seborrheic keratosis: Secondary | ICD-10-CM | POA: Diagnosis not present

## 2016-02-01 DIAGNOSIS — D2262 Melanocytic nevi of left upper limb, including shoulder: Secondary | ICD-10-CM | POA: Diagnosis not present

## 2016-02-01 DIAGNOSIS — I340 Nonrheumatic mitral (valve) insufficiency: Secondary | ICD-10-CM | POA: Insufficient documentation

## 2016-02-01 DIAGNOSIS — I517 Cardiomegaly: Secondary | ICD-10-CM | POA: Diagnosis not present

## 2016-02-01 DIAGNOSIS — L570 Actinic keratosis: Secondary | ICD-10-CM | POA: Diagnosis not present

## 2016-02-01 DIAGNOSIS — I700 Atherosclerosis of aorta: Secondary | ICD-10-CM | POA: Insufficient documentation

## 2016-02-01 DIAGNOSIS — I341 Nonrheumatic mitral (valve) prolapse: Secondary | ICD-10-CM | POA: Diagnosis not present

## 2016-02-01 DIAGNOSIS — L918 Other hypertrophic disorders of the skin: Secondary | ICD-10-CM | POA: Diagnosis not present

## 2016-02-01 DIAGNOSIS — I071 Rheumatic tricuspid insufficiency: Secondary | ICD-10-CM | POA: Diagnosis not present

## 2016-02-01 DIAGNOSIS — D2261 Melanocytic nevi of right upper limb, including shoulder: Secondary | ICD-10-CM | POA: Diagnosis not present

## 2016-02-01 DIAGNOSIS — L218 Other seborrheic dermatitis: Secondary | ICD-10-CM | POA: Diagnosis not present

## 2016-02-01 DIAGNOSIS — D225 Melanocytic nevi of trunk: Secondary | ICD-10-CM | POA: Diagnosis not present

## 2016-02-01 DIAGNOSIS — D1801 Hemangioma of skin and subcutaneous tissue: Secondary | ICD-10-CM | POA: Diagnosis not present

## 2016-02-01 DIAGNOSIS — R012 Other cardiac sounds: Secondary | ICD-10-CM | POA: Diagnosis present

## 2016-02-01 LAB — ECHOCARDIOGRAM COMPLETE
AOASC: 32 cm
CHL CUP DOP CALC LVOT VTI: 19.1 cm
CHL CUP RV SYS PRESS: 25 mmHg
CHL CUP STROKE VOLUME: 55 mL
E decel time: 261 msec
EERAT: 11.36
FS: 45 % — AB (ref 28–44)
IV/PV OW: 1.1
LA ID, A-P, ES: 39 mm
LA diam index: 1.87 cm/m2
LA vol A4C: 46 ml
LAVOL: 53 mL
LAVOLIN: 25.4 mL/m2
LEFT ATRIUM END SYS DIAM: 39 mm
LV SIMPSON'S DISK: 60
LV dias vol: 91 mL (ref 46–106)
LV e' LATERAL: 6.04 cm/s
LV sys vol: 36 mL (ref 14–42)
LVDIAVOLIN: 44 mL/m2
LVEEAVG: 11.36
LVEEMED: 11.36
LVOT SV: 60 mL
LVOT area: 3.14 cm2
LVOT diameter: 20 mm
LVOT peak grad rest: 3 mmHg
LVOTPV: 83.5 cm/s
LVSYSVOLIN: 17 mL/m2
Lateral S' vel: 15.1 cm/s
MV Dec: 261
MV pk A vel: 89.3 m/s
MVPKEVEL: 68.6 m/s
PW: 10.5 mm — AB (ref 0.6–1.1)
Reg peak vel: 236 cm/s
TDI e' lateral: 6.04
TDI e' medial: 3.9
TR max vel: 236 cm/s

## 2016-02-08 DIAGNOSIS — M25561 Pain in right knee: Secondary | ICD-10-CM | POA: Diagnosis not present

## 2016-02-08 DIAGNOSIS — R5381 Other malaise: Secondary | ICD-10-CM | POA: Diagnosis not present

## 2016-02-08 DIAGNOSIS — M797 Fibromyalgia: Secondary | ICD-10-CM | POA: Diagnosis not present

## 2016-02-08 DIAGNOSIS — M25551 Pain in right hip: Secondary | ICD-10-CM | POA: Diagnosis not present

## 2016-02-08 DIAGNOSIS — M79672 Pain in left foot: Secondary | ICD-10-CM | POA: Diagnosis not present

## 2016-03-21 ENCOUNTER — Encounter: Payer: Self-pay | Admitting: Internal Medicine

## 2016-03-21 DIAGNOSIS — Z1211 Encounter for screening for malignant neoplasm of colon: Secondary | ICD-10-CM

## 2016-03-25 MED ORDER — THYROID 130 MG PO TABS: 130.0000 mg | ORAL_TABLET | ORAL | 3 refills | Status: DC

## 2016-03-25 MED ORDER — THYROID 146.25 MG PO TABS: 146.2500 mg | ORAL_TABLET | ORAL | 3 refills | Status: DC

## 2016-03-25 NOTE — Telephone Encounter (Signed)
Please mail to patient

## 2016-04-22 ENCOUNTER — Ambulatory Visit: Payer: Medicare Other | Admitting: Internal Medicine

## 2016-05-02 DIAGNOSIS — M25551 Pain in right hip: Secondary | ICD-10-CM | POA: Diagnosis not present

## 2016-05-02 DIAGNOSIS — M1611 Unilateral primary osteoarthritis, right hip: Secondary | ICD-10-CM | POA: Diagnosis not present

## 2016-05-07 ENCOUNTER — Encounter: Payer: Self-pay | Admitting: Internal Medicine

## 2016-05-07 MED ORDER — THYROID 130 MG PO TABS: 130.0000 mg | ORAL_TABLET | ORAL | 3 refills | Status: AC

## 2016-05-07 MED ORDER — THYROID 146.25 MG PO TABS: 146.2500 mg | ORAL_TABLET | ORAL | 3 refills | Status: AC

## 2016-05-07 NOTE — Telephone Encounter (Signed)
Please mail these to address in mychart message.

## 2016-05-13 ENCOUNTER — Telehealth: Payer: Self-pay

## 2016-05-13 NOTE — Telephone Encounter (Signed)
I spoke to patient and rescheduled her appt due to Dr. Rexene Alberts being out of the office sick. Patient will see Cecille Rubin NP tomorrow. Patient asks for a referral to neurologist in [facility], since she has moved out that way.

## 2016-05-14 ENCOUNTER — Encounter: Payer: Self-pay | Admitting: Nurse Practitioner

## 2016-05-14 ENCOUNTER — Ambulatory Visit (INDEPENDENT_AMBULATORY_CARE_PROVIDER_SITE_OTHER): Payer: Medicare Other | Admitting: Nurse Practitioner

## 2016-05-14 ENCOUNTER — Ambulatory Visit: Payer: Medicare Other | Admitting: Neurology

## 2016-05-14 ENCOUNTER — Telehealth: Payer: Self-pay

## 2016-05-14 VITALS — BP 120/72 | HR 92 | Ht 65.5 in | Wt 200.2 lb

## 2016-05-14 DIAGNOSIS — E669 Obesity, unspecified: Secondary | ICD-10-CM | POA: Diagnosis not present

## 2016-05-14 DIAGNOSIS — G4733 Obstructive sleep apnea (adult) (pediatric): Secondary | ICD-10-CM

## 2016-05-14 NOTE — Patient Instructions (Signed)
Cpap compliance excellent Will check with D. Athar about sleep MD in [facility] No follow up planned

## 2016-05-14 NOTE — Progress Notes (Signed)
[facility]  PATIENT: Andrea Mckay DOB: 1945/11/02   REASON FOR VISIT: Follow-up for obstructive sleep apnea HISTORY FROM: patient    HISTORY OF PRESENT ILLNESS:Andrea Mckay is a 70 year old right-handed woman with an underlying medical history of hyperlipidemia, hypothyroidism, mitral valve prolapse, fibromyalgia (saw Dr. Estanislado Pandy) and obesity, who presents for follow-up consultation of her obstructive sleep apnea, after her recent sleep studies. The patient is unaccompanied today. I last saw her on 11/09/2014, at which time she reported feeling much improved with regards to her sleep. She also felt that her concentration mood irritability were much better. She noted better sleep consolidation and less daytime somnolence. She even felt improvement in her occasional restless leg symptoms. She was fully compliant with treatment. She had some nasal irritation with a nasal pillows but this improved. Her nocturia was about the same. Her dry eyes which she had chronically became a little bit worse with the CPAP air. She denied any previous issues with chronic lung disease. She had seen a pulmonologist in the distant past. She did admit to smoking memory [name] for about 20 years but quit many years ago. She had seen a cardiologist years ago.   , 05/15/2015:SA I reviewed her CPAP compliance data from 04/14/2015 through 05/13/2015 which is a total of 30 days during which time she used her machine every night with percent used days greater than 4 hours at 97%, indicating excellent compliance with an average usage of 6 hours and 23 minutes, residual AHI low at 1 per hour, leak acceptable with the 95th percentile at 14.4 L/m at a pressure of 10 cm with EPR of 3. 05/15/2015:SA She reports doing well, but has issues with allergy to her masks. She has tried many different masks. Has been using a barrier creme, and emu oil. The best she can do is alternate between the different masks she has  available. Overall, she feels well with CPAP therapy and endorses improved sleep and mood and is motivated to continue with treatment. She has been able to lose some weight, is doing a fast metabolism diet and eats a lot of vegetables and fruits. 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, 70 year old female returns for follow-up she has a history of obstructive sleep apnea on CPAP. Her CPAP compliance data from 04/14/2016 to 05/13/2016 shows compliance at 97% or 29 out of 30 days. Average usage days 5 hours 7 minutes. Set pressure 10 cm EPR level3 no leaks AHI 0.6  She has moved to [facility] and is wanting Dr. Rexene Alberts to recommend a sleep specialist in that area. She is frustrated that she has not been able to lose weight more weight being on CPAP as she had read on goggle. She is on a low carb diet. She has noted that when she drinks alcohol or has a glass of wine many times she wakes up around 2 AM. Her nocturia has improved. She returns for reevaluation  REVIEW OF SYSTEMS: Full 14 system review of systems performed and notable only for those listed, all others are neg:  Constitutional: neg  Cardiovascular: neg Ear/Nose/Throat: neg  Skin: neg Eyes: neg Respiratory: neg Gastroitestinal: neg  Hematology/Lymphatic: neg  Endocrine: neg Musculoskeletal: Joint pain walking difficulty Allergy/Immunology: neg Neurological: neg Psychiatric: neg Sleep : Obstructive sleep apnea with CPAP   ALLERGIES: Allergies  Allergen Reactions  . Erythromycin Rash  . Iodine Nausea Only, Rash and Other (See Comments)    arrythmia  .  Latex Rash and Cough  . Sulfonamide Derivatives   . Cephalosporins   . Ciprofloxacin   . Doxycycline   . Iohexol   . Lasix [Furosemide]   . Moxifloxacin   . Nabumetone   . Nitrofurantoin   . Nsaids Other (See Comments)    Told to avoid because of kidneys  . Ofloxacin   . Oxaprozin   . Quinolones   . Risedronate Sodium   . Soy  Allergy   . Spironolactone   . Levothyroxine Sodium Palpitations    HOME MEDICATIONS: Outpatient Medications Prior to Visit  Medication Sig Dispense Refill  . aspirin 81 MG tablet Take 350 mg by mouth 2 (two) times daily.     Marland Kitchen thyroid (ARMOUR) 130 MG tablet Take 1 tablet (130 mg total) by mouth every other day. 45 tablet 3  . Thyroid 146.25 MG TABS Take 146.25 mg by mouth every other day. 45 tablet 3   No facility-administered medications prior to visit.     PAST MEDICAL HISTORY: Past Medical History:  Diagnosis Date  . Allergy   . Arthritis    osteoarthritis  . Cancer ([place]) 01/2006   melanoma  . Candidiasis    vagina/vulva  . Coronary artery disease   . Diabetes mellitus    diet controlled  . Diarrhea   . Fibromyalgia   . Foreign body    foot  . Goiter   . Heart murmur   . Hyperlipidemia   . Kidney stone   . Malignant melanoma ([place])   . Myalgia and myositis, unspecified   . Osteoporosis    osteopenia  . Postmenopausal   . S/P colonoscopy   . Thyroid disease    hypothyroid  . Tick bite     PAST SURGICAL HISTORY: Past Surgical History:  Procedure Laterality Date  . ABDOMINAL HYSTERECTOMY  1987  . FOOT TENDON SURGERY  1982   Left  . KNEE ARTHROSCOPY W/ ACL RECONSTRUCTION    . LIPOMA EXCISION  01/20/2011  . OVARIAN CYST REMOVAL  1999   removed b/l   . THYROIDECTOMY  1982    FAMILY HISTORY: Family History  Problem Relation Age of Onset  . Cancer Paternal Grandmother     pancreatic  . Pancreatic cancer Paternal Grandmother   . Diabetes Maternal Aunt   . Atrial fibrillation Mother   . Hypertension Mother   . Colon polyps Mother   . Cancer Father 25    prostate  . Parkinsonism Paternal Uncle   . Heart disease Maternal Grandmother 75    MI  . Cancer Maternal Aunt 80    colon  . Prostate cancer    . Colon polyps Daughter     Adenomatous    SOCIAL HISTORY: Social History   Social History  . Marital status: Divorced    Spouse name: N/A  .  Number of children: N/A  . Years of education: N/A   Occupational History  . accounting P/T    Social History Main Topics  . Smoking status: Never Smoker  . Smokeless tobacco: Never Used  . Alcohol use 0.0 oz/week     Comment: Socially  . Drug use: No  . Sexual activity: No   Other Topics Concern  . Not on file   Social History Narrative  . No narrative on file     PHYSICAL EXAM  Vitals:   05/14/16 1441  BP: 120/72  Pulse: 92  Weight: 200 lb 3.2 oz (90.8 kg)  Height: 5' 5.5" (1.664  m)   Body mass index is 32.81 kg/m.  Generalized: Well developed, in no acute distress  Head: normocephalic and atraumatic,. Oropharynx benign  Neck: Supple, no carotid bruits  Cardiac: Regular rate rhythm, no murmur  Musculoskeletal: No deformity   Neurological examination   Mentation: Alert oriented to time, place, history taking. Attention span and concentration appropriate. Recent and remote memory intact.  Follows all commands speech and language fluent.   Cranial nerve II-XII: Pupils were equal round reactive to light extraocular movements were full, visual field were full on confrontational test. Facial sensation and strength were normal. hearing was intact to finger rubbing bilaterally. Uvula tongue midline. head turning and shoulder shrug were normal and symmetric.Tongue protrusion into cheek strength was normal. Motor: normal bulk and tone, full strength in the BUE, BLE, fine finger movements normal, no pronator drift. No focal weakness Sensory: normal and symmetric to light touch, Coordination: finger-nose-finger, heel-to-shin bilaterally, no dysmetria Reflexes: Symmetric upper and lower plantar responses were flexor bilaterally. Gait and Station: Rising up from seated position without assistance, normal stance,  moderate stride, good arm swing, smooth turning, able to perform tiptoe, and heel walking without difficulty. Tandem gait is unsteady  DIAGNOSTIC DATA (LABS, IMAGING,  TESTING) - I reviewed patient records, labs, notes, testing and imaging myself where available.      Component Value Date/Time   NA 140 07/24/2015 1528   K 4.2 07/24/2015 1528   CL 104 07/24/2015 1528   CO2 28 07/24/2015 1528   GLUCOSE 101 (H) 07/24/2015 1528   BUN 15 07/24/2015 1528   CREATININE 0.79 07/24/2015 1528   CALCIUM 9.2 07/24/2015 1528   PROT 6.9 07/24/2015 1528   ALBUMIN 4.0 07/24/2015 1528   AST 21 07/24/2015 1528   ALT 26 07/24/2015 1528   ALKPHOS 123 (H) 07/24/2015 1528   BILITOT 0.3 07/24/2015 1528   GFRNONAA 107 10/20/2008 1329   GFRAA 130 10/20/2008 1329   Lab Results  Component Value Date   CHOL 229 (H) 04/11/2015   HDL 59.00 04/11/2015   LDLCALC 146 (H) 04/11/2015   LDLDIRECT 152.1 07/03/2011   TRIG 120.0 04/11/2015   CHOLHDL 4 04/11/2015   Lab Results  Component Value Date   HGBA1C 6.3 07/24/2015   Lab Results  Component Value Date   VITAMINB12 510 04/11/2015   Lab Results  Component Value Date   TSH 0.46 10/19/2015      ASSESSMENT AND PLAN  70 y.o. year old female  has a past medical history of Allergy; Arthritis; Cancer ([place]) (01/2006); Coronary artery disease; Diabetes mellitus;  Fibromyalgia;  Goiter;  Hyperlipidemia; Malignant melanoma ([place]); Myalgia and myositis, unspecified; Osteoporosis; Postmenopausal; S/P colonoscopy; Thyroid disease; and Obstructive sleep apnea here to follow-up.  The patient is a current patient of Dr. Rexene Alberts  who is out of the office today . This note is sent to the work in doctor.     PLAN: Cpap compliance excellent Will check with D. Athar about sleep MD in [facility], pt has moved No follow up planned Dennie Bible, [facility], [facility], [facility] [address] [place], [place] [address] [phone]

## 2016-05-14 NOTE — Telephone Encounter (Signed)
Andrea Mckay was seen by Andrea Mckay today. Since last office visit, patient has moved to [place]. She said that today was her last office visit here. Andrea Mckay asks if you can recommend a neurologist for her in [place]?

## 2016-05-15 NOTE — Telephone Encounter (Signed)
I spoke to patient and she is ok with going to [place]. Per Dr. Rexene Alberts, ok to put in referral. Patient is ok with any doctor there.

## 2016-05-15 NOTE — Telephone Encounter (Signed)
Unfortunately, I am not familiar with the [facility] area. Of course, there is [facility]. Would be best, if she establishes care with a PCP there and he/she can make a suggestion/referral.

## 2016-05-15 NOTE — Addendum Note (Signed)
Addended by: Laurence Spates on: 05/15/2016 04:07 PM   Modules accepted: Orders

## 2016-05-28 ENCOUNTER — Encounter: Payer: Self-pay | Admitting: Internal Medicine

## 2016-05-28 MED ORDER — ONDANSETRON HCL 4 MG PO TABS: 4.0000 mg | ORAL_TABLET | Freq: Three times a day (TID) | ORAL | 0 refills | Status: AC | PRN

## 2016-05-30 DIAGNOSIS — R21 Rash and other nonspecific skin eruption: Secondary | ICD-10-CM | POA: Diagnosis not present

## 2016-06-09 ENCOUNTER — Encounter: Payer: Self-pay | Admitting: Internal Medicine

## 2016-06-09 DIAGNOSIS — Z888 Allergy status to other drugs, medicaments and biological substances status: Secondary | ICD-10-CM | POA: Diagnosis not present

## 2016-06-09 DIAGNOSIS — Z86010 Personal history of colonic polyps: Secondary | ICD-10-CM | POA: Diagnosis not present

## 2016-06-09 DIAGNOSIS — G4730 Sleep apnea, unspecified: Secondary | ICD-10-CM | POA: Diagnosis not present

## 2016-06-09 DIAGNOSIS — E039 Hypothyroidism, unspecified: Secondary | ICD-10-CM | POA: Diagnosis not present

## 2016-06-09 DIAGNOSIS — Z800 Family history of malignant neoplasm of digestive organs: Secondary | ICD-10-CM | POA: Diagnosis not present

## 2016-06-09 DIAGNOSIS — Z881 Allergy status to other antibiotic agents status: Secondary | ICD-10-CM | POA: Diagnosis not present

## 2016-06-09 DIAGNOSIS — D122 Benign neoplasm of ascending colon: Secondary | ICD-10-CM | POA: Diagnosis not present

## 2016-06-09 DIAGNOSIS — E11618 Type 2 diabetes mellitus with other diabetic arthropathy: Secondary | ICD-10-CM | POA: Diagnosis not present

## 2016-06-09 DIAGNOSIS — Z6832 Body mass index (BMI) 32.0-32.9, adult: Secondary | ICD-10-CM | POA: Diagnosis not present

## 2016-06-09 DIAGNOSIS — K5730 Diverticulosis of large intestine without perforation or abscess without bleeding: Secondary | ICD-10-CM | POA: Diagnosis not present

## 2016-06-09 DIAGNOSIS — K648 Other hemorrhoids: Secondary | ICD-10-CM | POA: Diagnosis not present

## 2016-06-09 DIAGNOSIS — E669 Obesity, unspecified: Secondary | ICD-10-CM | POA: Diagnosis not present

## 2016-06-09 DIAGNOSIS — K644 Residual hemorrhoidal skin tags: Secondary | ICD-10-CM | POA: Diagnosis not present

## 2016-06-09 DIAGNOSIS — Z91040 Latex allergy status: Secondary | ICD-10-CM | POA: Diagnosis not present

## 2016-06-09 DIAGNOSIS — Z08 Encounter for follow-up examination after completed treatment for malignant neoplasm: Secondary | ICD-10-CM | POA: Diagnosis not present

## 2016-06-09 DIAGNOSIS — Z882 Allergy status to sulfonamides status: Secondary | ICD-10-CM | POA: Diagnosis not present

## 2016-06-09 DIAGNOSIS — Z1211 Encounter for screening for malignant neoplasm of colon: Secondary | ICD-10-CM | POA: Diagnosis not present

## 2016-06-11 DIAGNOSIS — M7061 Trochanteric bursitis, right hip: Secondary | ICD-10-CM | POA: Diagnosis not present

## 2016-06-11 DIAGNOSIS — M160 Bilateral primary osteoarthritis of hip: Secondary | ICD-10-CM | POA: Diagnosis not present

## 2016-06-11 DIAGNOSIS — M1611 Unilateral primary osteoarthritis, right hip: Secondary | ICD-10-CM | POA: Diagnosis not present

## 2016-06-17 DIAGNOSIS — M722 Plantar fascial fibromatosis: Secondary | ICD-10-CM | POA: Diagnosis not present

## 2016-06-17 DIAGNOSIS — M79672 Pain in left foot: Secondary | ICD-10-CM | POA: Diagnosis not present

## 2016-06-19 NOTE — Progress Notes (Signed)
Personally have participated in and made any corrections needed to history, physical, neuro exam,assessment and plan as stated above.    Antonia Ahern, MD [facility] 

## 2016-07-03 DIAGNOSIS — M25561 Pain in right knee: Secondary | ICD-10-CM | POA: Diagnosis not present

## 2016-07-03 DIAGNOSIS — E119 Type 2 diabetes mellitus without complications: Secondary | ICD-10-CM | POA: Diagnosis not present

## 2016-07-03 DIAGNOSIS — M25551 Pain in right hip: Secondary | ICD-10-CM | POA: Diagnosis not present

## 2016-07-03 DIAGNOSIS — E669 Obesity, unspecified: Secondary | ICD-10-CM | POA: Diagnosis not present

## 2016-07-03 DIAGNOSIS — E6609 Other obesity due to excess calories: Secondary | ICD-10-CM | POA: Diagnosis not present

## 2016-07-21 DIAGNOSIS — M25551 Pain in right hip: Secondary | ICD-10-CM | POA: Diagnosis not present

## 2016-07-24 ENCOUNTER — Other Ambulatory Visit: Payer: Self-pay

## 2016-07-28 DIAGNOSIS — E669 Obesity, unspecified: Secondary | ICD-10-CM | POA: Diagnosis not present

## 2016-07-28 DIAGNOSIS — M25561 Pain in right knee: Secondary | ICD-10-CM | POA: Diagnosis not present

## 2016-07-28 DIAGNOSIS — M25551 Pain in right hip: Secondary | ICD-10-CM | POA: Diagnosis not present

## 2016-07-29 ENCOUNTER — Ambulatory Visit: Payer: Medicare Other | Admitting: Rheumatology

## 2016-07-29 DIAGNOSIS — M722 Plantar fascial fibromatosis: Secondary | ICD-10-CM | POA: Diagnosis not present

## 2016-07-30 ENCOUNTER — Encounter: Payer: Self-pay | Admitting: Nurse Practitioner

## 2016-07-31 ENCOUNTER — Telehealth: Payer: Self-pay | Admitting: Neurology

## 2016-07-31 NOTE — Telephone Encounter (Signed)
I called pt to discuss. No answer, left a message asking her to call me back. 

## 2016-07-31 NOTE — Telephone Encounter (Signed)
I spoke to pt. I advised her that usually we ask the DME to work with her to get a cpap mask that works for the pt if she is having a reaction to the mask. Pt says that she has already gone through 3 masks from her DME and she is still having a reaction. Pt wants Dr. Guadelupe Sabin recommendations.  Pt also says that she sleep so soundly with her cpap, that sometimes she rips off the mask without knowing it. Is there anything to help with this?  Of note, pt has moved to [facility] Dr. Rexene Alberts has put in a referral to [place], but no one from their office has called yet. Will send to [facility] to research this.  Pt says that her DME is still [facility]. I have pulled a compliance and therapy report for pt's cpap for Dr. Rexene Alberts to review.

## 2016-07-31 NOTE — Telephone Encounter (Signed)
Pt returned my call. I advised her that Dr. Rexene Alberts reviewed her cpap compliance report and recommends that pt speak with her DME to perhaps try a cloth or memory foam mask, and she may do better with that. I encouraged her to call [place] to discuss this. Pt verbalized understanding and will call [facility]. Pt says that she will call us back for further questions or concerns.

## 2016-07-31 NOTE — Telephone Encounter (Signed)
I reviewed her CPAP compliance data from 07/01/2016 through 07/30/2016 which is a total of 30 days, during which time she used her machine 27 days but percent used days greater than 4 hours is only 40%, indicating suboptimal compliance with an average usage of 3 hours and 42 minutes only, residual AHI and leak are appropriate on the setting of 10 cm with EPR of 3. For residual mask issues, I would recommend that she work closely with her DME company. There are some masks that do not have silicone, there is one with cloth or memory foam that touches the face, she may do better with that. Again, best thing is to contact her DME company. Please relay back to patient.  thx

## 2016-07-31 NOTE — Telephone Encounter (Signed)
Patient is calling to discuss her CPAP mask. It makes her nose break out and also she sleeps so soundly that she takes it off sometimes during the night and does not realize it. Please call to discuss.

## 2016-08-05 NOTE — Telephone Encounter (Signed)
Called Called and spoke to patient and Im sending her referral to [facility]   Neurology clinic . Telephone [phone]- fax [phone].   I have spoke to patient with all details and gave patient the telephone number to follow up. Thanks Hinton Dyer

## 2016-08-06 DIAGNOSIS — R2681 Unsteadiness on feet: Secondary | ICD-10-CM | POA: Diagnosis not present

## 2016-08-06 DIAGNOSIS — M7061 Trochanteric bursitis, right hip: Secondary | ICD-10-CM | POA: Diagnosis not present

## 2016-08-06 DIAGNOSIS — R262 Difficulty in walking, not elsewhere classified: Secondary | ICD-10-CM | POA: Diagnosis not present

## 2016-08-07 DIAGNOSIS — Z85820 Personal history of malignant melanoma of skin: Secondary | ICD-10-CM | POA: Diagnosis not present

## 2016-08-07 DIAGNOSIS — L820 Inflamed seborrheic keratosis: Secondary | ICD-10-CM | POA: Diagnosis not present

## 2016-08-07 DIAGNOSIS — L910 Hypertrophic scar: Secondary | ICD-10-CM | POA: Diagnosis not present

## 2016-08-07 DIAGNOSIS — R2681 Unsteadiness on feet: Secondary | ICD-10-CM | POA: Diagnosis not present

## 2016-08-07 DIAGNOSIS — M7061 Trochanteric bursitis, right hip: Secondary | ICD-10-CM | POA: Diagnosis not present

## 2016-08-07 DIAGNOSIS — D485 Neoplasm of uncertain behavior of skin: Secondary | ICD-10-CM | POA: Diagnosis not present

## 2016-08-07 DIAGNOSIS — R262 Difficulty in walking, not elsewhere classified: Secondary | ICD-10-CM | POA: Diagnosis not present

## 2016-08-08 DIAGNOSIS — Z0000 Encounter for general adult medical examination without abnormal findings: Secondary | ICD-10-CM | POA: Diagnosis not present

## 2016-08-14 DIAGNOSIS — M7061 Trochanteric bursitis, right hip: Secondary | ICD-10-CM | POA: Diagnosis not present

## 2016-08-14 DIAGNOSIS — R262 Difficulty in walking, not elsewhere classified: Secondary | ICD-10-CM | POA: Diagnosis not present

## 2016-08-14 DIAGNOSIS — R2681 Unsteadiness on feet: Secondary | ICD-10-CM | POA: Diagnosis not present

## 2016-08-20 DIAGNOSIS — R2681 Unsteadiness on feet: Secondary | ICD-10-CM | POA: Diagnosis not present

## 2016-08-20 DIAGNOSIS — R262 Difficulty in walking, not elsewhere classified: Secondary | ICD-10-CM | POA: Diagnosis not present

## 2016-08-20 DIAGNOSIS — M7061 Trochanteric bursitis, right hip: Secondary | ICD-10-CM | POA: Diagnosis not present

## 2016-08-22 ENCOUNTER — Encounter: Payer: Self-pay | Admitting: Internal Medicine

## 2016-08-22 DIAGNOSIS — M7061 Trochanteric bursitis, right hip: Secondary | ICD-10-CM | POA: Diagnosis not present

## 2016-08-22 DIAGNOSIS — R262 Difficulty in walking, not elsewhere classified: Secondary | ICD-10-CM | POA: Diagnosis not present

## 2016-08-22 DIAGNOSIS — R2681 Unsteadiness on feet: Secondary | ICD-10-CM | POA: Diagnosis not present

## 2016-08-27 DIAGNOSIS — R2681 Unsteadiness on feet: Secondary | ICD-10-CM | POA: Diagnosis not present

## 2016-08-27 DIAGNOSIS — M7061 Trochanteric bursitis, right hip: Secondary | ICD-10-CM | POA: Diagnosis not present

## 2016-08-27 DIAGNOSIS — R262 Difficulty in walking, not elsewhere classified: Secondary | ICD-10-CM | POA: Diagnosis not present

## 2016-08-29 DIAGNOSIS — R2681 Unsteadiness on feet: Secondary | ICD-10-CM | POA: Diagnosis not present

## 2016-08-29 DIAGNOSIS — R262 Difficulty in walking, not elsewhere classified: Secondary | ICD-10-CM | POA: Diagnosis not present

## 2016-08-29 DIAGNOSIS — M7061 Trochanteric bursitis, right hip: Secondary | ICD-10-CM | POA: Diagnosis not present

## 2016-09-02 DIAGNOSIS — R262 Difficulty in walking, not elsewhere classified: Secondary | ICD-10-CM | POA: Diagnosis not present

## 2016-09-02 DIAGNOSIS — R2681 Unsteadiness on feet: Secondary | ICD-10-CM | POA: Diagnosis not present

## 2016-09-02 DIAGNOSIS — M7061 Trochanteric bursitis, right hip: Secondary | ICD-10-CM | POA: Diagnosis not present

## 2016-09-08 DIAGNOSIS — R2681 Unsteadiness on feet: Secondary | ICD-10-CM | POA: Diagnosis not present

## 2016-09-08 DIAGNOSIS — R262 Difficulty in walking, not elsewhere classified: Secondary | ICD-10-CM | POA: Diagnosis not present

## 2016-09-08 DIAGNOSIS — M7061 Trochanteric bursitis, right hip: Secondary | ICD-10-CM | POA: Diagnosis not present

## 2016-09-09 DIAGNOSIS — E559 Vitamin D deficiency, unspecified: Secondary | ICD-10-CM | POA: Diagnosis not present

## 2016-09-09 DIAGNOSIS — E119 Type 2 diabetes mellitus without complications: Secondary | ICD-10-CM | POA: Diagnosis not present

## 2016-09-09 DIAGNOSIS — G4730 Sleep apnea, unspecified: Secondary | ICD-10-CM | POA: Diagnosis not present

## 2016-09-09 DIAGNOSIS — K635 Polyp of colon: Secondary | ICD-10-CM | POA: Diagnosis not present

## 2016-09-09 DIAGNOSIS — E039 Hypothyroidism, unspecified: Secondary | ICD-10-CM | POA: Diagnosis not present

## 2016-09-17 DIAGNOSIS — R262 Difficulty in walking, not elsewhere classified: Secondary | ICD-10-CM | POA: Diagnosis not present

## 2016-09-17 DIAGNOSIS — M7061 Trochanteric bursitis, right hip: Secondary | ICD-10-CM | POA: Diagnosis not present

## 2016-09-17 DIAGNOSIS — R2681 Unsteadiness on feet: Secondary | ICD-10-CM | POA: Diagnosis not present

## 2016-09-25 DIAGNOSIS — M7061 Trochanteric bursitis, right hip: Secondary | ICD-10-CM | POA: Diagnosis not present

## 2016-09-25 DIAGNOSIS — R262 Difficulty in walking, not elsewhere classified: Secondary | ICD-10-CM | POA: Diagnosis not present

## 2016-09-25 DIAGNOSIS — R2681 Unsteadiness on feet: Secondary | ICD-10-CM | POA: Diagnosis not present

## 2016-09-25 DIAGNOSIS — Z23 Encounter for immunization: Secondary | ICD-10-CM | POA: Diagnosis not present

## 2016-10-07 DIAGNOSIS — L300 Nummular dermatitis: Secondary | ICD-10-CM | POA: Diagnosis not present

## 2016-10-31 DIAGNOSIS — E669 Obesity, unspecified: Secondary | ICD-10-CM | POA: Diagnosis not present

## 2016-10-31 DIAGNOSIS — Z6830 Body mass index (BMI) 30.0-30.9, adult: Secondary | ICD-10-CM | POA: Diagnosis not present

## 2016-10-31 DIAGNOSIS — E119 Type 2 diabetes mellitus without complications: Secondary | ICD-10-CM | POA: Diagnosis not present

## 2016-11-03 ENCOUNTER — Other Ambulatory Visit: Payer: Self-pay | Admitting: Internal Medicine

## 2016-11-25 DIAGNOSIS — G4733 Obstructive sleep apnea (adult) (pediatric): Secondary | ICD-10-CM | POA: Diagnosis not present

## 2016-11-25 DIAGNOSIS — Z9989 Dependence on other enabling machines and devices: Secondary | ICD-10-CM | POA: Diagnosis not present

## 2016-11-25 DIAGNOSIS — G479 Sleep disorder, unspecified: Secondary | ICD-10-CM | POA: Diagnosis not present

## 2016-12-02 DIAGNOSIS — N3941 Urge incontinence: Secondary | ICD-10-CM | POA: Diagnosis not present

## 2016-12-02 DIAGNOSIS — R5383 Other fatigue: Secondary | ICD-10-CM | POA: Diagnosis not present

## 2016-12-02 DIAGNOSIS — I519 Heart disease, unspecified: Secondary | ICD-10-CM | POA: Diagnosis not present

## 2016-12-02 DIAGNOSIS — M546 Pain in thoracic spine: Secondary | ICD-10-CM | POA: Diagnosis not present

## 2016-12-02 DIAGNOSIS — E119 Type 2 diabetes mellitus without complications: Secondary | ICD-10-CM | POA: Diagnosis not present

## 2016-12-02 DIAGNOSIS — R1031 Right lower quadrant pain: Secondary | ICD-10-CM | POA: Diagnosis not present

## 2016-12-11 DIAGNOSIS — F419 Anxiety disorder, unspecified: Secondary | ICD-10-CM | POA: Diagnosis not present

## 2016-12-11 DIAGNOSIS — M109 Gout, unspecified: Secondary | ICD-10-CM | POA: Diagnosis not present

## 2016-12-11 DIAGNOSIS — Z6834 Body mass index (BMI) 34.0-34.9, adult: Secondary | ICD-10-CM | POA: Diagnosis not present

## 2016-12-11 DIAGNOSIS — H2513 Age-related nuclear cataract, bilateral: Secondary | ICD-10-CM | POA: Diagnosis not present

## 2016-12-11 DIAGNOSIS — Z79899 Other long term (current) drug therapy: Secondary | ICD-10-CM | POA: Diagnosis not present

## 2016-12-11 DIAGNOSIS — H43393 Other vitreous opacities, bilateral: Secondary | ICD-10-CM | POA: Diagnosis not present

## 2016-12-11 DIAGNOSIS — H04129 Dry eye syndrome of unspecified lacrimal gland: Secondary | ICD-10-CM | POA: Diagnosis not present

## 2016-12-11 DIAGNOSIS — E119 Type 2 diabetes mellitus without complications: Secondary | ICD-10-CM | POA: Diagnosis not present

## 2016-12-11 DIAGNOSIS — E669 Obesity, unspecified: Secondary | ICD-10-CM | POA: Diagnosis not present

## 2016-12-11 DIAGNOSIS — E1136 Type 2 diabetes mellitus with diabetic cataract: Secondary | ICD-10-CM | POA: Diagnosis not present

## 2016-12-11 DIAGNOSIS — M47814 Spondylosis without myelopathy or radiculopathy, thoracic region: Secondary | ICD-10-CM | POA: Diagnosis not present

## 2016-12-11 DIAGNOSIS — Z882 Allergy status to sulfonamides status: Secondary | ICD-10-CM | POA: Diagnosis not present

## 2016-12-11 DIAGNOSIS — M791 Myalgia: Secondary | ICD-10-CM | POA: Diagnosis not present

## 2016-12-11 DIAGNOSIS — Z83518 Family history of other specified eye disorder: Secondary | ICD-10-CM | POA: Diagnosis not present

## 2016-12-11 DIAGNOSIS — M546 Pain in thoracic spine: Secondary | ICD-10-CM | POA: Diagnosis not present

## 2016-12-11 DIAGNOSIS — Z8249 Family history of ischemic heart disease and other diseases of the circulatory system: Secondary | ICD-10-CM | POA: Diagnosis not present

## 2016-12-11 DIAGNOSIS — I209 Angina pectoris, unspecified: Secondary | ICD-10-CM | POA: Diagnosis not present

## 2016-12-11 DIAGNOSIS — E039 Hypothyroidism, unspecified: Secondary | ICD-10-CM | POA: Diagnosis not present

## 2016-12-12 DIAGNOSIS — S22000D Wedge compression fracture of unspecified thoracic vertebra, subsequent encounter for fracture with routine healing: Secondary | ICD-10-CM | POA: Diagnosis not present

## 2016-12-12 DIAGNOSIS — M8008XA Age-related osteoporosis with current pathological fracture, vertebra(e), initial encounter for fracture: Secondary | ICD-10-CM | POA: Diagnosis not present

## 2016-12-15 DIAGNOSIS — M25559 Pain in unspecified hip: Secondary | ICD-10-CM | POA: Diagnosis not present

## 2016-12-15 DIAGNOSIS — R1031 Right lower quadrant pain: Secondary | ICD-10-CM | POA: Diagnosis not present

## 2016-12-15 DIAGNOSIS — M797 Fibromyalgia: Secondary | ICD-10-CM | POA: Diagnosis not present

## 2016-12-24 DIAGNOSIS — K439 Ventral hernia without obstruction or gangrene: Secondary | ICD-10-CM | POA: Diagnosis not present

## 2016-12-24 DIAGNOSIS — I708 Atherosclerosis of other arteries: Secondary | ICD-10-CM | POA: Diagnosis not present

## 2016-12-24 DIAGNOSIS — D1809 Hemangioma of other sites: Secondary | ICD-10-CM | POA: Diagnosis not present

## 2016-12-24 DIAGNOSIS — R1031 Right lower quadrant pain: Secondary | ICD-10-CM | POA: Diagnosis not present

## 2016-12-24 DIAGNOSIS — K5730 Diverticulosis of large intestine without perforation or abscess without bleeding: Secondary | ICD-10-CM | POA: Diagnosis not present

## 2016-12-24 DIAGNOSIS — M8538 Osteitis condensans, other site: Secondary | ICD-10-CM | POA: Diagnosis not present

## 2016-12-24 DIAGNOSIS — R1030 Lower abdominal pain, unspecified: Secondary | ICD-10-CM | POA: Diagnosis not present

## 2016-12-24 DIAGNOSIS — Z9049 Acquired absence of other specified parts of digestive tract: Secondary | ICD-10-CM | POA: Diagnosis not present

## 2016-12-25 DIAGNOSIS — M8588 Other specified disorders of bone density and structure, other site: Secondary | ICD-10-CM | POA: Diagnosis not present

## 2016-12-25 DIAGNOSIS — M8589 Other specified disorders of bone density and structure, multiple sites: Secondary | ICD-10-CM | POA: Diagnosis not present

## 2016-12-25 DIAGNOSIS — M85852 Other specified disorders of bone density and structure, left thigh: Secondary | ICD-10-CM | POA: Diagnosis not present

## 2016-12-25 DIAGNOSIS — Z780 Asymptomatic menopausal state: Secondary | ICD-10-CM | POA: Diagnosis not present

## 2016-12-26 DIAGNOSIS — M546 Pain in thoracic spine: Secondary | ICD-10-CM | POA: Diagnosis not present

## 2017-01-06 DIAGNOSIS — R1013 Epigastric pain: Secondary | ICD-10-CM | POA: Diagnosis not present

## 2017-01-06 DIAGNOSIS — E039 Hypothyroidism, unspecified: Secondary | ICD-10-CM | POA: Diagnosis not present

## 2017-01-06 DIAGNOSIS — E669 Obesity, unspecified: Secondary | ICD-10-CM | POA: Diagnosis not present

## 2017-01-06 DIAGNOSIS — L300 Nummular dermatitis: Secondary | ICD-10-CM | POA: Diagnosis not present

## 2017-01-08 DIAGNOSIS — R1013 Epigastric pain: Secondary | ICD-10-CM | POA: Diagnosis not present

## 2017-01-09 DIAGNOSIS — M546 Pain in thoracic spine: Secondary | ICD-10-CM | POA: Diagnosis not present

## 2017-01-14 ENCOUNTER — Telehealth (INDEPENDENT_AMBULATORY_CARE_PROVIDER_SITE_OTHER): Payer: Self-pay | Admitting: Rheumatology

## 2017-01-16 DIAGNOSIS — M546 Pain in thoracic spine: Secondary | ICD-10-CM | POA: Diagnosis not present

## 2017-01-19 DIAGNOSIS — S22000D Wedge compression fracture of unspecified thoracic vertebra, subsequent encounter for fracture with routine healing: Secondary | ICD-10-CM | POA: Diagnosis not present

## 2017-01-19 DIAGNOSIS — M791 Myalgia: Secondary | ICD-10-CM | POA: Diagnosis not present

## 2017-01-19 DIAGNOSIS — M546 Pain in thoracic spine: Secondary | ICD-10-CM | POA: Diagnosis not present

## 2017-01-22 DIAGNOSIS — M546 Pain in thoracic spine: Secondary | ICD-10-CM | POA: Diagnosis not present

## 2017-01-22 DIAGNOSIS — S22060D Wedge compression fracture of T7-T8 vertebra, subsequent encounter for fracture with routine healing: Secondary | ICD-10-CM | POA: Diagnosis not present

## 2017-01-26 DIAGNOSIS — R1031 Right lower quadrant pain: Secondary | ICD-10-CM | POA: Diagnosis not present

## 2017-01-26 DIAGNOSIS — K429 Umbilical hernia without obstruction or gangrene: Secondary | ICD-10-CM | POA: Diagnosis not present

## 2017-01-27 ENCOUNTER — Telehealth: Payer: Self-pay | Admitting: Neurology

## 2017-01-27 ENCOUNTER — Telehealth (INDEPENDENT_AMBULATORY_CARE_PROVIDER_SITE_OTHER): Payer: Self-pay | Admitting: Rheumatology

## 2017-01-27 NOTE — Telephone Encounter (Signed)
Pt would like a call back , she is wanting to know how many hours into her sleep study did her oxygen levels go down to 81-82% please call

## 2017-01-27 NOTE — Telephone Encounter (Signed)
Dr. Angelene Giovanni [address] [place], [place]  [address]

## 2017-01-27 NOTE — Telephone Encounter (Signed)
Copy of records mailed to Dr. Angelene Giovanni (too many to fax)

## 2017-01-28 DIAGNOSIS — Z23 Encounter for immunization: Secondary | ICD-10-CM | POA: Diagnosis not present

## 2017-01-28 NOTE — Telephone Encounter (Signed)
Noted, thank you

## 2017-01-28 NOTE — Telephone Encounter (Signed)
Spoke with patient regarding her sleep study. She was curious in what time did her oxygen levels start dropping on her 1st sleep study. I explained it was 1 hr after light out. She wakes after 4hours of sleep every night and was wondering if it was because of low oxygen. I explained that once she was treated with CPAP her oxygen levels remained in the 90's. She understood and was appreciative of information

## 2017-02-05 DIAGNOSIS — S22060D Wedge compression fracture of T7-T8 vertebra, subsequent encounter for fracture with routine healing: Secondary | ICD-10-CM | POA: Diagnosis not present

## 2017-02-05 DIAGNOSIS — M546 Pain in thoracic spine: Secondary | ICD-10-CM | POA: Diagnosis not present

## 2017-02-17 DIAGNOSIS — G4733 Obstructive sleep apnea (adult) (pediatric): Secondary | ICD-10-CM | POA: Diagnosis not present

## 2017-02-17 DIAGNOSIS — R749 Abnormal serum enzyme level, unspecified: Secondary | ICD-10-CM | POA: Diagnosis not present

## 2017-02-17 DIAGNOSIS — R7989 Other specified abnormal findings of blood chemistry: Secondary | ICD-10-CM | POA: Diagnosis not present

## 2017-02-17 DIAGNOSIS — E039 Hypothyroidism, unspecified: Secondary | ICD-10-CM | POA: Diagnosis not present

## 2017-02-17 DIAGNOSIS — R5383 Other fatigue: Secondary | ICD-10-CM | POA: Diagnosis not present

## 2017-02-17 DIAGNOSIS — R634 Abnormal weight loss: Secondary | ICD-10-CM | POA: Diagnosis not present

## 2017-02-17 DIAGNOSIS — Z1159 Encounter for screening for other viral diseases: Secondary | ICD-10-CM | POA: Diagnosis not present

## 2017-02-19 DIAGNOSIS — S22060D Wedge compression fracture of T7-T8 vertebra, subsequent encounter for fracture with routine healing: Secondary | ICD-10-CM | POA: Diagnosis not present

## 2017-02-19 DIAGNOSIS — M546 Pain in thoracic spine: Secondary | ICD-10-CM | POA: Diagnosis not present

## 2017-02-23 DIAGNOSIS — R5383 Other fatigue: Secondary | ICD-10-CM | POA: Diagnosis not present

## 2017-02-23 DIAGNOSIS — R7989 Other specified abnormal findings of blood chemistry: Secondary | ICD-10-CM | POA: Diagnosis not present

## 2017-02-23 DIAGNOSIS — R749 Abnormal serum enzyme level, unspecified: Secondary | ICD-10-CM | POA: Diagnosis not present

## 2017-02-25 DIAGNOSIS — S60351A Superficial foreign body of right thumb, initial encounter: Secondary | ICD-10-CM | POA: Diagnosis not present

## 2017-02-25 DIAGNOSIS — R945 Abnormal results of liver function studies: Secondary | ICD-10-CM | POA: Diagnosis not present

## 2017-02-25 DIAGNOSIS — R21 Rash and other nonspecific skin eruption: Secondary | ICD-10-CM | POA: Diagnosis not present

## 2017-02-25 DIAGNOSIS — W458XXA Other foreign body or object entering through skin, initial encounter: Secondary | ICD-10-CM | POA: Diagnosis not present

## 2017-02-25 DIAGNOSIS — R748 Abnormal levels of other serum enzymes: Secondary | ICD-10-CM | POA: Diagnosis not present

## 2017-03-18 DIAGNOSIS — L814 Other melanin hyperpigmentation: Secondary | ICD-10-CM | POA: Diagnosis not present

## 2017-03-18 DIAGNOSIS — D1801 Hemangioma of skin and subcutaneous tissue: Secondary | ICD-10-CM | POA: Diagnosis not present

## 2017-03-18 DIAGNOSIS — Z85820 Personal history of malignant melanoma of skin: Secondary | ICD-10-CM | POA: Diagnosis not present

## 2017-03-18 DIAGNOSIS — L718 Other rosacea: Secondary | ICD-10-CM | POA: Diagnosis not present

## 2017-04-22 DIAGNOSIS — T1512XA Foreign body in conjunctival sac, left eye, initial encounter: Secondary | ICD-10-CM | POA: Diagnosis not present

## 2017-05-13 DIAGNOSIS — I519 Heart disease, unspecified: Secondary | ICD-10-CM | POA: Diagnosis not present

## 2017-05-13 DIAGNOSIS — R748 Abnormal levels of other serum enzymes: Secondary | ICD-10-CM | POA: Diagnosis not present

## 2017-05-13 DIAGNOSIS — M8588 Other specified disorders of bone density and structure, other site: Secondary | ICD-10-CM | POA: Diagnosis not present

## 2017-05-13 DIAGNOSIS — E039 Hypothyroidism, unspecified: Secondary | ICD-10-CM | POA: Diagnosis not present

## 2017-05-13 DIAGNOSIS — E669 Obesity, unspecified: Secondary | ICD-10-CM | POA: Diagnosis not present

## 2017-05-13 DIAGNOSIS — E785 Hyperlipidemia, unspecified: Secondary | ICD-10-CM | POA: Diagnosis not present

## 2017-05-20 DIAGNOSIS — R1011 Right upper quadrant pain: Secondary | ICD-10-CM | POA: Diagnosis not present

## 2017-05-25 DIAGNOSIS — R1011 Right upper quadrant pain: Secondary | ICD-10-CM | POA: Diagnosis not present

## 2017-05-29 DIAGNOSIS — M797 Fibromyalgia: Secondary | ICD-10-CM | POA: Diagnosis not present

## 2017-05-29 DIAGNOSIS — M159 Polyosteoarthritis, unspecified: Secondary | ICD-10-CM | POA: Diagnosis not present

## 2017-06-03 DIAGNOSIS — Z23 Encounter for immunization: Secondary | ICD-10-CM | POA: Diagnosis not present

## 2017-06-09 DIAGNOSIS — E669 Obesity, unspecified: Secondary | ICD-10-CM | POA: Diagnosis not present

## 2017-06-09 DIAGNOSIS — M2550 Pain in unspecified joint: Secondary | ICD-10-CM | POA: Diagnosis not present

## 2017-06-19 DIAGNOSIS — M797 Fibromyalgia: Secondary | ICD-10-CM | POA: Diagnosis not present

## 2017-07-06 DIAGNOSIS — E039 Hypothyroidism, unspecified: Secondary | ICD-10-CM | POA: Diagnosis not present

## 2017-07-06 DIAGNOSIS — E785 Hyperlipidemia, unspecified: Secondary | ICD-10-CM | POA: Diagnosis not present

## 2017-07-06 DIAGNOSIS — R748 Abnormal levels of other serum enzymes: Secondary | ICD-10-CM | POA: Diagnosis not present

## 2017-07-14 DIAGNOSIS — I5189 Other ill-defined heart diseases: Secondary | ICD-10-CM | POA: Diagnosis not present

## 2017-07-14 DIAGNOSIS — M797 Fibromyalgia: Secondary | ICD-10-CM | POA: Diagnosis not present

## 2017-07-14 DIAGNOSIS — G4733 Obstructive sleep apnea (adult) (pediatric): Secondary | ICD-10-CM | POA: Diagnosis not present

## 2017-07-14 DIAGNOSIS — E039 Hypothyroidism, unspecified: Secondary | ICD-10-CM | POA: Diagnosis not present

## 2017-07-14 DIAGNOSIS — E669 Obesity, unspecified: Secondary | ICD-10-CM | POA: Diagnosis not present

## 2017-07-15 DIAGNOSIS — G4733 Obstructive sleep apnea (adult) (pediatric): Secondary | ICD-10-CM | POA: Diagnosis not present

## 2017-07-15 DIAGNOSIS — Z9989 Dependence on other enabling machines and devices: Secondary | ICD-10-CM | POA: Diagnosis not present

## 2017-08-10 DIAGNOSIS — R300 Dysuria: Secondary | ICD-10-CM | POA: Diagnosis not present

## 2017-08-13 DIAGNOSIS — E119 Type 2 diabetes mellitus without complications: Secondary | ICD-10-CM | POA: Diagnosis not present

## 2017-08-13 DIAGNOSIS — R109 Unspecified abdominal pain: Secondary | ICD-10-CM | POA: Diagnosis not present
